# Patient Record
Sex: Female | Born: 1955 | Race: White | Hispanic: No | Smoking: Former smoker
Health system: Southern US, Community
[De-identification: ages and names within clinical notes are randomized; demographics above are authoritative.]

## PROBLEM LIST (undated history)

## (undated) DIAGNOSIS — F329 Major depressive disorder, single episode, unspecified: Secondary | ICD-10-CM

## (undated) DIAGNOSIS — F32A Depression, unspecified: Secondary | ICD-10-CM

## (undated) DIAGNOSIS — K219 Gastro-esophageal reflux disease without esophagitis: Secondary | ICD-10-CM

## (undated) DIAGNOSIS — G2581 Restless legs syndrome: Secondary | ICD-10-CM

## (undated) DIAGNOSIS — I10 Essential (primary) hypertension: Secondary | ICD-10-CM

## (undated) DIAGNOSIS — Z9889 Other specified postprocedural states: Secondary | ICD-10-CM

## (undated) DIAGNOSIS — Z972 Presence of dental prosthetic device (complete) (partial): Secondary | ICD-10-CM

## (undated) DIAGNOSIS — Z8489 Family history of other specified conditions: Secondary | ICD-10-CM

## (undated) DIAGNOSIS — K625 Hemorrhage of anus and rectum: Secondary | ICD-10-CM

## (undated) DIAGNOSIS — M1712 Unilateral primary osteoarthritis, left knee: Secondary | ICD-10-CM

## (undated) DIAGNOSIS — F419 Anxiety disorder, unspecified: Secondary | ICD-10-CM

## (undated) DIAGNOSIS — E119 Type 2 diabetes mellitus without complications: Secondary | ICD-10-CM

## (undated) DIAGNOSIS — T7840XA Allergy, unspecified, initial encounter: Secondary | ICD-10-CM

## (undated) DIAGNOSIS — M1711 Unilateral primary osteoarthritis, right knee: Secondary | ICD-10-CM

## (undated) DIAGNOSIS — Z8719 Personal history of other diseases of the digestive system: Secondary | ICD-10-CM

## (undated) DIAGNOSIS — R112 Nausea with vomiting, unspecified: Secondary | ICD-10-CM

## (undated) HISTORY — DX: Allergy, unspecified, initial encounter: T78.40XA

## (undated) HISTORY — DX: Hemorrhage of anus and rectum: K62.5

## (undated) HISTORY — DX: Restless legs syndrome: G25.81

## (undated) HISTORY — PX: PARTIAL KNEE ARTHROPLASTY: SHX2174

## (undated) HISTORY — DX: Depression, unspecified: F32.A

## (undated) HISTORY — PX: NO PAST SURGERIES: SHX2092

## (undated) HISTORY — DX: Presence of dental prosthetic device (complete) (partial): Z97.2

## (undated) HISTORY — DX: Major depressive disorder, single episode, unspecified: F32.9

---

## 1997-08-10 ENCOUNTER — Ambulatory Visit (HOSPITAL_COMMUNITY): Admission: RE | Admit: 1997-08-10 | Discharge: 1997-08-10 | Payer: Self-pay | Admitting: Obstetrics and Gynecology

## 1997-08-13 ENCOUNTER — Ambulatory Visit (HOSPITAL_COMMUNITY): Admission: RE | Admit: 1997-08-13 | Discharge: 1997-08-13 | Payer: Self-pay | Admitting: Obstetrics and Gynecology

## 2010-12-22 ENCOUNTER — Ambulatory Visit (INDEPENDENT_AMBULATORY_CARE_PROVIDER_SITE_OTHER): Payer: BC Managed Care – PPO | Admitting: General Surgery

## 2010-12-22 ENCOUNTER — Encounter (INDEPENDENT_AMBULATORY_CARE_PROVIDER_SITE_OTHER): Payer: Self-pay | Admitting: General Surgery

## 2010-12-22 VITALS — BP 156/88 | HR 64 | Temp 98.4°F | Ht 67.0 in | Wt 221.8 lb

## 2010-12-22 DIAGNOSIS — K644 Residual hemorrhoidal skin tags: Secondary | ICD-10-CM | POA: Insufficient documentation

## 2010-12-22 NOTE — Patient Instructions (Signed)
Calmoseptine to rectum 2-3 times a day and after showers or bowel movements

## 2010-12-27 NOTE — Progress Notes (Signed)
Subjective:     Patient ID: Jane Brown, female   DOB: 1955-11-03, 55 y.o.   MRN: [id number]  HPI We are asked to see the patient in consultation by Dr. Aram Beecham white to evaluate her for hemorrhoids. The patient is a 56 year old female who states she's had the pain for a long time at her rectum secondary to hemorrhoids. She will only on occasion notice any blood with her stool. She denies any constipation. She has had 2 colonoscopies in the past that were unremarkable.  Review of Systems  Constitutional: Negative.   HENT: Negative.   Eyes: Negative.   Respiratory: Negative.   Cardiovascular: Negative.   Gastrointestinal: Negative.   Genitourinary: Negative.   Musculoskeletal: Negative.   Skin: Negative.   Neurological: Negative.   Hematological: Negative.   Psychiatric/Behavioral: Negative.        Objective:   Physical Exam  Constitutional: She is oriented to person, place, and time. She appears well-developed and well-nourished.  HENT:  Head: Normocephalic and atraumatic.  Eyes: Conjunctivae and EOM are normal. Pupils are equal, round, and reactive to light.  Neck: Normal range of motion. Neck supple.  Cardiovascular: Normal rate, regular rhythm and normal heart sounds.   Pulmonary/Chest: Effort normal and breath sounds normal.  Abdominal: Soft. Bowel sounds are normal.  Genitourinary:       The patient has some minor soft external hemorrhoidal skin tags. She has some minor irritation to the perirectal area. On digital exam there is no palpable mass. On anoscopic exam she has minimal internal hemorrhoidal tissue.  Musculoskeletal: Normal range of motion.  Neurological: She is alert and oriented to person, place, and time.  Skin: Skin is warm and dry.  Psychiatric: She has a normal mood and affect. Her behavior is normal.       Assessment:     Given her exam I suspect that most of her discomfort is coming from pruritus ani.    Plan:     I will try her on Calmoseptine  ointment to her perirectal area several times a day for the next few weeks. She will do baby wipes after bowel movements. We will see her back in about 3 weeks.

## 2011-01-12 ENCOUNTER — Encounter (INDEPENDENT_AMBULATORY_CARE_PROVIDER_SITE_OTHER): Payer: BC Managed Care – PPO | Admitting: General Surgery

## 2011-02-20 ENCOUNTER — Encounter (INDEPENDENT_AMBULATORY_CARE_PROVIDER_SITE_OTHER): Payer: BC Managed Care – PPO | Admitting: General Surgery

## 2011-03-21 ENCOUNTER — Inpatient Hospital Stay (HOSPITAL_COMMUNITY): Admit: 2011-03-21 | Payer: Self-pay

## 2011-03-21 ENCOUNTER — Other Ambulatory Visit (HOSPITAL_COMMUNITY): Admission: RE | Admit: 2011-03-21 | Payer: BC Managed Care – PPO | Source: Ambulatory Visit | Admitting: Family Medicine

## 2011-03-21 DIAGNOSIS — Z0000 Encounter for general adult medical examination without abnormal findings: Secondary | ICD-10-CM | POA: Insufficient documentation

## 2012-09-10 ENCOUNTER — Ambulatory Visit: Payer: Self-pay

## 2013-12-26 ENCOUNTER — Other Ambulatory Visit: Payer: Self-pay | Admitting: Family Medicine

## 2013-12-26 ENCOUNTER — Other Ambulatory Visit (HOSPITAL_COMMUNITY)
Admission: RE | Admit: 2013-12-26 | Discharge: 2013-12-26 | Disposition: A | Discharge status: Home / Self Care | Payer: BC Managed Care – PPO | Source: Ambulatory Visit | Attending: Family Medicine | Admitting: Family Medicine

## 2013-12-26 DIAGNOSIS — Z0000 Encounter for general adult medical examination without abnormal findings: Secondary | ICD-10-CM | POA: Diagnosis present

## 2013-12-29 LAB — CYTOLOGY - PAP

## 2014-07-20 DIAGNOSIS — N7689 Other specified inflammation of vagina and vulva: Secondary | ICD-10-CM | POA: Diagnosis not present

## 2014-10-14 DIAGNOSIS — R922 Inconclusive mammogram: Secondary | ICD-10-CM | POA: Diagnosis not present

## 2014-10-14 DIAGNOSIS — R928 Other abnormal and inconclusive findings on diagnostic imaging of breast: Secondary | ICD-10-CM | POA: Diagnosis not present

## 2015-01-22 DIAGNOSIS — M25562 Pain in left knee: Secondary | ICD-10-CM | POA: Diagnosis not present

## 2015-01-22 DIAGNOSIS — M1712 Unilateral primary osteoarthritis, left knee: Secondary | ICD-10-CM | POA: Diagnosis not present

## 2015-01-29 DIAGNOSIS — M170 Bilateral primary osteoarthritis of knee: Secondary | ICD-10-CM | POA: Diagnosis not present

## 2015-01-29 DIAGNOSIS — R262 Difficulty in walking, not elsewhere classified: Secondary | ICD-10-CM | POA: Diagnosis not present

## 2015-01-29 DIAGNOSIS — M25562 Pain in left knee: Secondary | ICD-10-CM | POA: Diagnosis not present

## 2015-01-29 DIAGNOSIS — M25561 Pain in right knee: Secondary | ICD-10-CM | POA: Diagnosis not present

## 2015-02-03 DIAGNOSIS — M170 Bilateral primary osteoarthritis of knee: Secondary | ICD-10-CM | POA: Diagnosis not present

## 2015-02-03 DIAGNOSIS — M25561 Pain in right knee: Secondary | ICD-10-CM | POA: Diagnosis not present

## 2015-02-03 DIAGNOSIS — M1712 Unilateral primary osteoarthritis, left knee: Secondary | ICD-10-CM | POA: Diagnosis not present

## 2015-02-03 DIAGNOSIS — M25562 Pain in left knee: Secondary | ICD-10-CM | POA: Diagnosis not present

## 2015-02-03 DIAGNOSIS — R2689 Other abnormalities of gait and mobility: Secondary | ICD-10-CM | POA: Diagnosis not present

## 2015-02-05 DIAGNOSIS — M25561 Pain in right knee: Secondary | ICD-10-CM | POA: Diagnosis not present

## 2015-02-05 DIAGNOSIS — M25562 Pain in left knee: Secondary | ICD-10-CM | POA: Diagnosis not present

## 2015-02-05 DIAGNOSIS — M1711 Unilateral primary osteoarthritis, right knee: Secondary | ICD-10-CM | POA: Diagnosis not present

## 2015-02-05 DIAGNOSIS — M170 Bilateral primary osteoarthritis of knee: Secondary | ICD-10-CM | POA: Diagnosis not present

## 2015-02-05 DIAGNOSIS — R2689 Other abnormalities of gait and mobility: Secondary | ICD-10-CM | POA: Diagnosis not present

## 2015-02-08 DIAGNOSIS — M25562 Pain in left knee: Secondary | ICD-10-CM | POA: Diagnosis not present

## 2015-02-08 DIAGNOSIS — R269 Unspecified abnormalities of gait and mobility: Secondary | ICD-10-CM | POA: Diagnosis not present

## 2015-02-08 DIAGNOSIS — M25561 Pain in right knee: Secondary | ICD-10-CM | POA: Diagnosis not present

## 2015-02-08 DIAGNOSIS — M1712 Unilateral primary osteoarthritis, left knee: Secondary | ICD-10-CM | POA: Diagnosis not present

## 2015-02-08 DIAGNOSIS — M170 Bilateral primary osteoarthritis of knee: Secondary | ICD-10-CM | POA: Diagnosis not present

## 2015-02-12 DIAGNOSIS — M25562 Pain in left knee: Secondary | ICD-10-CM | POA: Diagnosis not present

## 2015-02-12 DIAGNOSIS — R269 Unspecified abnormalities of gait and mobility: Secondary | ICD-10-CM | POA: Diagnosis not present

## 2015-02-12 DIAGNOSIS — M1711 Unilateral primary osteoarthritis, right knee: Secondary | ICD-10-CM | POA: Diagnosis not present

## 2015-02-12 DIAGNOSIS — M170 Bilateral primary osteoarthritis of knee: Secondary | ICD-10-CM | POA: Diagnosis not present

## 2015-02-12 DIAGNOSIS — M25561 Pain in right knee: Secondary | ICD-10-CM | POA: Diagnosis not present

## 2015-02-15 DIAGNOSIS — N952 Postmenopausal atrophic vaginitis: Secondary | ICD-10-CM | POA: Diagnosis not present

## 2015-02-15 DIAGNOSIS — K648 Other hemorrhoids: Secondary | ICD-10-CM | POA: Diagnosis not present

## 2015-02-15 DIAGNOSIS — G2581 Restless legs syndrome: Secondary | ICD-10-CM | POA: Diagnosis not present

## 2015-02-15 DIAGNOSIS — Z86010 Personal history of colonic polyps: Secondary | ICD-10-CM | POA: Diagnosis not present

## 2015-02-17 DIAGNOSIS — M25562 Pain in left knee: Secondary | ICD-10-CM | POA: Diagnosis not present

## 2015-02-17 DIAGNOSIS — M1712 Unilateral primary osteoarthritis, left knee: Secondary | ICD-10-CM | POA: Diagnosis not present

## 2015-02-18 DIAGNOSIS — R269 Unspecified abnormalities of gait and mobility: Secondary | ICD-10-CM | POA: Diagnosis not present

## 2015-02-18 DIAGNOSIS — M25562 Pain in left knee: Secondary | ICD-10-CM | POA: Diagnosis not present

## 2015-02-18 DIAGNOSIS — M1711 Unilateral primary osteoarthritis, right knee: Secondary | ICD-10-CM | POA: Diagnosis not present

## 2015-02-18 DIAGNOSIS — M25561 Pain in right knee: Secondary | ICD-10-CM | POA: Diagnosis not present

## 2015-02-18 DIAGNOSIS — M170 Bilateral primary osteoarthritis of knee: Secondary | ICD-10-CM | POA: Diagnosis not present

## 2015-02-22 DIAGNOSIS — M170 Bilateral primary osteoarthritis of knee: Secondary | ICD-10-CM | POA: Diagnosis not present

## 2015-02-22 DIAGNOSIS — M1712 Unilateral primary osteoarthritis, left knee: Secondary | ICD-10-CM | POA: Diagnosis not present

## 2015-02-22 DIAGNOSIS — R269 Unspecified abnormalities of gait and mobility: Secondary | ICD-10-CM | POA: Diagnosis not present

## 2015-02-22 DIAGNOSIS — M25562 Pain in left knee: Secondary | ICD-10-CM | POA: Diagnosis not present

## 2015-02-22 DIAGNOSIS — M25561 Pain in right knee: Secondary | ICD-10-CM | POA: Diagnosis not present

## 2015-02-25 DIAGNOSIS — M1711 Unilateral primary osteoarthritis, right knee: Secondary | ICD-10-CM | POA: Diagnosis not present

## 2015-02-25 DIAGNOSIS — M25561 Pain in right knee: Secondary | ICD-10-CM | POA: Diagnosis not present

## 2015-02-25 DIAGNOSIS — M25562 Pain in left knee: Secondary | ICD-10-CM | POA: Diagnosis not present

## 2015-02-25 DIAGNOSIS — M170 Bilateral primary osteoarthritis of knee: Secondary | ICD-10-CM | POA: Diagnosis not present

## 2015-02-25 DIAGNOSIS — R262 Difficulty in walking, not elsewhere classified: Secondary | ICD-10-CM | POA: Diagnosis not present

## 2015-03-08 DIAGNOSIS — Z86010 Personal history of colonic polyps: Secondary | ICD-10-CM | POA: Diagnosis not present

## 2015-03-08 DIAGNOSIS — D125 Benign neoplasm of sigmoid colon: Secondary | ICD-10-CM | POA: Diagnosis not present

## 2015-03-08 DIAGNOSIS — K635 Polyp of colon: Secondary | ICD-10-CM | POA: Diagnosis not present

## 2015-06-08 DIAGNOSIS — M170 Bilateral primary osteoarthritis of knee: Secondary | ICD-10-CM | POA: Diagnosis not present

## 2015-06-08 DIAGNOSIS — M25562 Pain in left knee: Secondary | ICD-10-CM | POA: Diagnosis not present

## 2015-06-08 DIAGNOSIS — M7052 Other bursitis of knee, left knee: Secondary | ICD-10-CM | POA: Diagnosis not present

## 2015-06-14 DIAGNOSIS — M25562 Pain in left knee: Secondary | ICD-10-CM | POA: Diagnosis not present

## 2015-06-14 DIAGNOSIS — M1712 Unilateral primary osteoarthritis, left knee: Secondary | ICD-10-CM | POA: Diagnosis not present

## 2015-07-08 DIAGNOSIS — Z1231 Encounter for screening mammogram for malignant neoplasm of breast: Secondary | ICD-10-CM | POA: Diagnosis not present

## 2015-08-17 DIAGNOSIS — E559 Vitamin D deficiency, unspecified: Secondary | ICD-10-CM | POA: Diagnosis not present

## 2015-08-17 DIAGNOSIS — K219 Gastro-esophageal reflux disease without esophagitis: Secondary | ICD-10-CM | POA: Diagnosis not present

## 2015-08-17 DIAGNOSIS — G2581 Restless legs syndrome: Secondary | ICD-10-CM | POA: Diagnosis not present

## 2015-08-17 DIAGNOSIS — E785 Hyperlipidemia, unspecified: Secondary | ICD-10-CM | POA: Diagnosis not present

## 2015-08-17 DIAGNOSIS — N952 Postmenopausal atrophic vaginitis: Secondary | ICD-10-CM | POA: Diagnosis not present

## 2015-08-17 DIAGNOSIS — Z23 Encounter for immunization: Secondary | ICD-10-CM | POA: Diagnosis not present

## 2015-08-17 DIAGNOSIS — K641 Second degree hemorrhoids: Secondary | ICD-10-CM | POA: Diagnosis not present

## 2015-08-17 DIAGNOSIS — Z0000 Encounter for general adult medical examination without abnormal findings: Secondary | ICD-10-CM | POA: Diagnosis not present

## 2015-08-18 DIAGNOSIS — K648 Other hemorrhoids: Secondary | ICD-10-CM | POA: Diagnosis not present

## 2015-08-18 DIAGNOSIS — Z86010 Personal history of colonic polyps: Secondary | ICD-10-CM | POA: Diagnosis not present

## 2015-08-23 DIAGNOSIS — K641 Second degree hemorrhoids: Secondary | ICD-10-CM | POA: Diagnosis not present

## 2015-09-14 DIAGNOSIS — K641 Second degree hemorrhoids: Secondary | ICD-10-CM | POA: Diagnosis not present

## 2015-09-28 DIAGNOSIS — K641 Second degree hemorrhoids: Secondary | ICD-10-CM | POA: Diagnosis not present

## 2016-01-19 DIAGNOSIS — M25561 Pain in right knee: Secondary | ICD-10-CM | POA: Diagnosis not present

## 2016-01-19 DIAGNOSIS — M1712 Unilateral primary osteoarthritis, left knee: Secondary | ICD-10-CM | POA: Diagnosis not present

## 2016-01-19 DIAGNOSIS — M170 Bilateral primary osteoarthritis of knee: Secondary | ICD-10-CM | POA: Diagnosis not present

## 2016-01-19 DIAGNOSIS — R262 Difficulty in walking, not elsewhere classified: Secondary | ICD-10-CM | POA: Diagnosis not present

## 2016-01-19 DIAGNOSIS — M25562 Pain in left knee: Secondary | ICD-10-CM | POA: Diagnosis not present

## 2016-01-31 DIAGNOSIS — M25562 Pain in left knee: Secondary | ICD-10-CM | POA: Diagnosis not present

## 2016-01-31 DIAGNOSIS — M25561 Pain in right knee: Secondary | ICD-10-CM | POA: Diagnosis not present

## 2016-01-31 DIAGNOSIS — M170 Bilateral primary osteoarthritis of knee: Secondary | ICD-10-CM | POA: Diagnosis not present

## 2016-02-07 DIAGNOSIS — M1712 Unilateral primary osteoarthritis, left knee: Secondary | ICD-10-CM | POA: Diagnosis not present

## 2016-02-07 DIAGNOSIS — M25562 Pain in left knee: Secondary | ICD-10-CM | POA: Diagnosis not present

## 2016-02-10 DIAGNOSIS — M25561 Pain in right knee: Secondary | ICD-10-CM | POA: Diagnosis not present

## 2016-02-10 DIAGNOSIS — M1711 Unilateral primary osteoarthritis, right knee: Secondary | ICD-10-CM | POA: Diagnosis not present

## 2016-02-14 DIAGNOSIS — M170 Bilateral primary osteoarthritis of knee: Secondary | ICD-10-CM | POA: Diagnosis not present

## 2016-02-14 DIAGNOSIS — M25562 Pain in left knee: Secondary | ICD-10-CM | POA: Diagnosis not present

## 2016-02-14 DIAGNOSIS — M25561 Pain in right knee: Secondary | ICD-10-CM | POA: Diagnosis not present

## 2016-05-16 DIAGNOSIS — K219 Gastro-esophageal reflux disease without esophagitis: Secondary | ICD-10-CM | POA: Diagnosis not present

## 2016-05-16 DIAGNOSIS — F330 Major depressive disorder, recurrent, mild: Secondary | ICD-10-CM | POA: Diagnosis not present

## 2016-05-16 DIAGNOSIS — G2581 Restless legs syndrome: Secondary | ICD-10-CM | POA: Diagnosis not present

## 2016-05-23 DIAGNOSIS — M170 Bilateral primary osteoarthritis of knee: Secondary | ICD-10-CM | POA: Diagnosis not present

## 2016-05-23 DIAGNOSIS — R262 Difficulty in walking, not elsewhere classified: Secondary | ICD-10-CM | POA: Diagnosis not present

## 2016-05-23 DIAGNOSIS — M25561 Pain in right knee: Secondary | ICD-10-CM | POA: Diagnosis not present

## 2016-05-23 DIAGNOSIS — M25562 Pain in left knee: Secondary | ICD-10-CM | POA: Diagnosis not present

## 2016-06-16 DIAGNOSIS — F419 Anxiety disorder, unspecified: Secondary | ICD-10-CM | POA: Diagnosis not present

## 2016-06-16 DIAGNOSIS — F330 Major depressive disorder, recurrent, mild: Secondary | ICD-10-CM | POA: Diagnosis not present

## 2016-08-14 DIAGNOSIS — Z1231 Encounter for screening mammogram for malignant neoplasm of breast: Secondary | ICD-10-CM | POA: Diagnosis not present

## 2016-08-22 DIAGNOSIS — M25562 Pain in left knee: Secondary | ICD-10-CM | POA: Diagnosis not present

## 2016-08-22 DIAGNOSIS — M1712 Unilateral primary osteoarthritis, left knee: Secondary | ICD-10-CM | POA: Diagnosis not present

## 2016-08-22 DIAGNOSIS — M170 Bilateral primary osteoarthritis of knee: Secondary | ICD-10-CM | POA: Diagnosis not present

## 2016-08-22 DIAGNOSIS — M25561 Pain in right knee: Secondary | ICD-10-CM | POA: Diagnosis not present

## 2016-09-04 DIAGNOSIS — N3001 Acute cystitis with hematuria: Secondary | ICD-10-CM | POA: Diagnosis not present

## 2016-09-04 DIAGNOSIS — R3915 Urgency of urination: Secondary | ICD-10-CM | POA: Diagnosis not present

## 2016-09-28 DIAGNOSIS — R300 Dysuria: Secondary | ICD-10-CM | POA: Diagnosis not present

## 2016-09-28 DIAGNOSIS — N3001 Acute cystitis with hematuria: Secondary | ICD-10-CM | POA: Diagnosis not present

## 2016-11-29 DIAGNOSIS — M170 Bilateral primary osteoarthritis of knee: Secondary | ICD-10-CM | POA: Diagnosis not present

## 2016-11-29 DIAGNOSIS — M25562 Pain in left knee: Secondary | ICD-10-CM | POA: Diagnosis not present

## 2016-11-29 DIAGNOSIS — M25561 Pain in right knee: Secondary | ICD-10-CM | POA: Diagnosis not present

## 2016-12-07 DIAGNOSIS — M1712 Unilateral primary osteoarthritis, left knee: Secondary | ICD-10-CM | POA: Diagnosis not present

## 2016-12-07 DIAGNOSIS — M25562 Pain in left knee: Secondary | ICD-10-CM | POA: Diagnosis not present

## 2016-12-14 DIAGNOSIS — F330 Major depressive disorder, recurrent, mild: Secondary | ICD-10-CM | POA: Diagnosis not present

## 2016-12-14 DIAGNOSIS — M1712 Unilateral primary osteoarthritis, left knee: Secondary | ICD-10-CM | POA: Diagnosis not present

## 2016-12-14 DIAGNOSIS — G2581 Restless legs syndrome: Secondary | ICD-10-CM | POA: Diagnosis not present

## 2016-12-14 DIAGNOSIS — M25562 Pain in left knee: Secondary | ICD-10-CM | POA: Diagnosis not present

## 2016-12-14 DIAGNOSIS — F419 Anxiety disorder, unspecified: Secondary | ICD-10-CM | POA: Diagnosis not present

## 2016-12-21 DIAGNOSIS — M1712 Unilateral primary osteoarthritis, left knee: Secondary | ICD-10-CM | POA: Diagnosis not present

## 2016-12-21 DIAGNOSIS — M25562 Pain in left knee: Secondary | ICD-10-CM | POA: Diagnosis not present

## 2017-02-14 DIAGNOSIS — R300 Dysuria: Secondary | ICD-10-CM | POA: Diagnosis not present

## 2017-02-14 DIAGNOSIS — N3000 Acute cystitis without hematuria: Secondary | ICD-10-CM | POA: Diagnosis not present

## 2017-07-05 DIAGNOSIS — Z789 Other specified health status: Secondary | ICD-10-CM | POA: Diagnosis not present

## 2017-07-05 DIAGNOSIS — M1712 Unilateral primary osteoarthritis, left knee: Secondary | ICD-10-CM | POA: Diagnosis not present

## 2017-07-05 DIAGNOSIS — M25562 Pain in left knee: Secondary | ICD-10-CM | POA: Diagnosis not present

## 2017-07-12 ENCOUNTER — Other Ambulatory Visit (HOSPITAL_COMMUNITY)
Admission: RE | Admit: 2017-07-12 | Discharge: 2017-07-12 | Disposition: A | Discharge status: Home / Self Care | Payer: Medicare HMO | Source: Ambulatory Visit | Attending: Family Medicine | Admitting: Family Medicine

## 2017-07-12 ENCOUNTER — Other Ambulatory Visit: Payer: Self-pay | Admitting: Family Medicine

## 2017-07-12 DIAGNOSIS — K219 Gastro-esophageal reflux disease without esophagitis: Secondary | ICD-10-CM | POA: Diagnosis not present

## 2017-07-12 DIAGNOSIS — Z79899 Other long term (current) drug therapy: Secondary | ICD-10-CM | POA: Diagnosis not present

## 2017-07-12 DIAGNOSIS — E785 Hyperlipidemia, unspecified: Secondary | ICD-10-CM | POA: Diagnosis not present

## 2017-07-12 DIAGNOSIS — Z124 Encounter for screening for malignant neoplasm of cervix: Secondary | ICD-10-CM | POA: Diagnosis not present

## 2017-07-12 DIAGNOSIS — Z0000 Encounter for general adult medical examination without abnormal findings: Secondary | ICD-10-CM | POA: Diagnosis not present

## 2017-07-12 DIAGNOSIS — Z01419 Encounter for gynecological examination (general) (routine) without abnormal findings: Secondary | ICD-10-CM | POA: Diagnosis not present

## 2017-07-12 DIAGNOSIS — Z136 Encounter for screening for cardiovascular disorders: Secondary | ICD-10-CM | POA: Diagnosis not present

## 2017-07-12 DIAGNOSIS — E2839 Other primary ovarian failure: Secondary | ICD-10-CM | POA: Diagnosis not present

## 2017-07-12 DIAGNOSIS — E559 Vitamin D deficiency, unspecified: Secondary | ICD-10-CM | POA: Diagnosis not present

## 2017-07-12 DIAGNOSIS — F330 Major depressive disorder, recurrent, mild: Secondary | ICD-10-CM | POA: Diagnosis not present

## 2017-07-17 LAB — CYTOLOGY - PAP
Diagnosis: UNDETERMINED — AB
HPV (WINDOPATH): DETECTED — AB

## 2017-08-07 ENCOUNTER — Other Ambulatory Visit: Payer: Self-pay | Admitting: Obstetrics and Gynecology

## 2017-08-07 DIAGNOSIS — N871 Moderate cervical dysplasia: Secondary | ICD-10-CM | POA: Diagnosis not present

## 2017-08-07 DIAGNOSIS — N870 Mild cervical dysplasia: Secondary | ICD-10-CM | POA: Diagnosis not present

## 2017-09-11 ENCOUNTER — Other Ambulatory Visit: Payer: Self-pay | Admitting: Obstetrics and Gynecology

## 2017-09-11 DIAGNOSIS — N870 Mild cervical dysplasia: Secondary | ICD-10-CM | POA: Diagnosis not present

## 2017-09-12 DIAGNOSIS — E2839 Other primary ovarian failure: Secondary | ICD-10-CM | POA: Diagnosis not present

## 2017-09-17 DIAGNOSIS — L570 Actinic keratosis: Secondary | ICD-10-CM | POA: Diagnosis not present

## 2017-09-17 DIAGNOSIS — L819 Disorder of pigmentation, unspecified: Secondary | ICD-10-CM | POA: Diagnosis not present

## 2017-09-26 DIAGNOSIS — Z9889 Other specified postprocedural states: Secondary | ICD-10-CM | POA: Diagnosis not present

## 2017-09-27 DIAGNOSIS — Z1231 Encounter for screening mammogram for malignant neoplasm of breast: Secondary | ICD-10-CM | POA: Diagnosis not present

## 2017-10-03 DIAGNOSIS — M25562 Pain in left knee: Secondary | ICD-10-CM | POA: Diagnosis not present

## 2017-10-03 DIAGNOSIS — M1711 Unilateral primary osteoarthritis, right knee: Secondary | ICD-10-CM | POA: Diagnosis not present

## 2017-10-09 DIAGNOSIS — Z9889 Other specified postprocedural states: Secondary | ICD-10-CM | POA: Diagnosis not present

## 2017-10-10 DIAGNOSIS — Z01818 Encounter for other preprocedural examination: Secondary | ICD-10-CM | POA: Diagnosis not present

## 2017-11-08 NOTE — Pre-Procedure Instructions (Signed)
MAEVA DANT  11/08/2017      [id number] [address], [address] AT [address] [place] [address] Phone: [phone] Fax: [phone]    Your procedure is scheduled on Tues., November 20, 2017 from 7:30AM-9:40AM  Report to [facility] Admitting Entrance "A" at 5:30AM  Call this number if you have problems the morning of surgery:  [phone]   Remember:  Do not eat or drink after midnight on July 22nd    Take these medicines the morning of surgery with A SIP OF WATER: FLUoxetine (PROZAC) Omeprazole (PRILOSEC)   7 days before surgery (7/16), stop taking all Other Aspirin Products, Vitamins, Fish oils, and Herbal medications. Also stop all NSAIDS i.e. Advil, Ibuprofen, Motrin, Aleve, Anaprox, Naproxen, BC, Goody Powders, and all Supplements.    Do not wear jewelry, make-up or nail polish.  Do not wear lotions, powders, or perfumes, or deodorant.  Do not shave 48 hours prior to surgery.    Do not bring valuables to the hospital.  [facility] is not responsible for any belongings or valuables.  Contacts, dentures or bridgework may not be worn into surgery.  Leave your suitcase in the car.  After surgery it may be brought to your room.  For patients admitted to the hospital, discharge time will be determined by your treatment team.  Patients discharged the day of surgery will not be allowed to drive home.   Special instructions:   [facility]- Preparing For Surgery  Before surgery, you can play an important role. Because skin is not sterile, your skin needs to be as free of germs as possible. You can reduce the number of germs on your skin by washing with CHG (chlorahexidine gluconate) Soap before surgery.  CHG is an antiseptic cleaner which kills germs and bonds with the skin to continue killing germs even after washing.    Oral Hygiene is also important to reduce your risk of infection.   Remember - BRUSH YOUR TEETH THE MORNING OF SURGERY WITH YOUR REGULAR TOOTHPASTE  Please do not use if you have an allergy to CHG or antibacterial soaps. If your skin becomes reddened/irritated stop using the CHG.  Do not shave (including legs and underarms) for at least 48 hours prior to first CHG shower. It is OK to shave your face.  Please follow these instructions carefully.   1. Shower the NIGHT BEFORE SURGERY and the MORNING OF SURGERY with CHG.   2. If you chose to wash your hair, wash your hair first as usual with your normal shampoo.  3. After you shampoo, rinse your hair and body thoroughly to remove the shampoo.  4. Use CHG as you would any other liquid soap. You can apply CHG directly to the skin and wash gently with a scrungie or a clean washcloth.   5. Apply the CHG Soap to your body ONLY FROM THE NECK DOWN.  Do not use on open wounds or open sores. Avoid contact with your eyes, ears, mouth and genitals (private parts). Wash Face and genitals (private parts)  with your normal soap.  6. Wash thoroughly, paying special attention to the area where your surgery will be performed.  7. Thoroughly rinse your body with warm water from the neck down.  8. DO NOT shower/wash with your normal soap after using and rinsing off the CHG Soap.  9. Pat yourself dry with a CLEAN TOWEL.  10.  Wear CLEAN PAJAMAS to bed the night before surgery, wear comfortable clothes the morning of surgery  11. Place CLEAN SHEETS on your bed the night of your first shower and DO NOT SLEEP WITH PETS.  Day of Surgery:  Do not apply any deodorants/lotions.  Please wear clean clothes to the hospital/surgery center.   Remember to brush your teeth WITH YOUR REGULAR TOOTHPASTE.  Please read over the following fact sheets that you were given. Pain Booklet, Coughing and Deep Breathing, MRSA Information and Surgical Site Infection Prevention

## 2017-11-09 ENCOUNTER — Other Ambulatory Visit: Payer: Self-pay

## 2017-11-09 ENCOUNTER — Encounter (HOSPITAL_COMMUNITY)
Admission: RE | Admit: 2017-11-09 | Discharge: 2017-11-09 | Disposition: A | Discharge status: Home / Self Care | Payer: Medicare HMO | Source: Ambulatory Visit | Attending: Orthopedic Surgery | Admitting: Orthopedic Surgery

## 2017-11-09 ENCOUNTER — Encounter (HOSPITAL_COMMUNITY): Payer: Self-pay

## 2017-11-09 DIAGNOSIS — Z01812 Encounter for preprocedural laboratory examination: Secondary | ICD-10-CM | POA: Diagnosis not present

## 2017-11-09 HISTORY — DX: Family history of other specified conditions: Z84.89

## 2017-11-09 LAB — BASIC METABOLIC PANEL
Anion gap: 11 (ref 5–15)
BUN: 9 mg/dL (ref 6–20)
CALCIUM: 9.3 mg/dL (ref 8.9–10.3)
CO2: 22 mmol/L (ref 22–32)
Chloride: 107 mmol/L (ref 98–111)
Creatinine, Ser: 0.87 mg/dL (ref 0.44–1.00)
GFR calc Af Amer: >60 mL/min (ref >60)
GFR calc non Af Amer: >60 mL/min (ref >60)
GLUCOSE: 119 mg/dL — AB (ref 70–99)
Potassium: 3.7 mmol/L (ref 3.5–5.1)
SODIUM: 140 mmol/L (ref 135–145)

## 2017-11-09 LAB — CBC
HCT: 43.6 % (ref 36.0–46.0)
Hemoglobin: 14 g/dL (ref 12.0–15.0)
MCH: 28.3 pg (ref 26.0–34.0)
MCHC: 32.1 g/dL (ref 30.0–36.0)
MCV: 88.1 fL (ref 78.0–100.0)
PLATELETS: 228 10*3/uL (ref 150–400)
RBC: 4.95 MIL/uL (ref 3.87–5.11)
RDW: 14 % (ref 11.5–15.5)
WBC: 8.1 10*3/uL (ref 4.0–10.5)

## 2017-11-09 LAB — SURGICAL PCR SCREEN
MRSA, PCR: NEGATIVE
STAPHYLOCOCCUS AUREUS: POSITIVE — AB

## 2017-11-09 NOTE — Progress Notes (Addendum)
PCP: Harlan Stains, MD  Cardiologist: pt denies  EKG:  10/2017-requested from Harlan Stains, MD  Stress test: pt denies  ECHO: pt denies  Cardiac Cath: pt denies  Chest x-ray: pt denies past year, no recent respiratory complications/infections

## 2017-11-15 DIAGNOSIS — H524 Presbyopia: Secondary | ICD-10-CM | POA: Diagnosis not present

## 2017-11-19 MED ORDER — TRANEXAMIC ACID 1000 MG/10ML IV SOLN
1000.0000 mg | INTRAVENOUS | Status: AC
  Administered 2017-11-20: 1000 mg via INTRAVENOUS
  Filled 2017-11-19: qty 1100

## 2017-11-19 NOTE — Anesthesia Preprocedure Evaluation (Addendum)
Anesthesia Evaluation  Patient identified by MRN, date of birth, ID band Patient awake    Reviewed: Allergy & Precautions, H&P , NPO status , Patient's Chart, lab work & pertinent test results  History of Anesthesia Complications Negative for: history of anesthetic complications  Airway Mallampati: II  TM Distance: >3 FB Neck ROM: full    Dental  (+) Edentulous Upper, Missing, Dental Advidsory Given   Pulmonary former smoker,    breath sounds clear to auscultation       Cardiovascular (-) anginanegative cardio ROS   Rhythm:regular Rate:Normal     Neuro/Psych PSYCHIATRIC DISORDERS Depression negative neurological ROS     GI/Hepatic Neg liver ROS, GERD  Medicated and Controlled,  Endo/Other  Morbid obesity  Renal/GU negative Renal ROS     Musculoskeletal  (+) Arthritis , Osteoarthritis,    Abdominal (+) + obese,   Peds  Hematology plt 228k   Anesthesia Other Findings   Reproductive/Obstetrics                           Anesthesia Physical Anesthesia Plan  ASA: II  Anesthesia Plan: Spinal   Post-op Pain Management:  Regional for Post-op pain   Induction:   PONV Risk Score and Plan: 2 and Ondansetron and Dexamethasone  Airway Management Planned: Natural Airway and Simple Face Mask  Additional Equipment:   Intra-op Plan:   Post-operative Plan:   Informed Consent: I have reviewed the patients History and Physical, chart, labs and discussed the procedure including the risks, benefits and alternatives for the proposed anesthesia with the patient or authorized representative who has indicated his/her understanding and acceptance.   Dental Advisory Given and Dental advisory given  Plan Discussed with: CRNA and Surgeon  Anesthesia Plan Comments: (Plan routine monitors, SAB with adductor canal block for post op analgesia)       Anesthesia Quick Evaluation

## 2017-11-20 ENCOUNTER — Encounter (HOSPITAL_COMMUNITY)
Admission: AD | Disposition: A | Discharge status: Home / Self Care | Payer: Self-pay | Source: Home / Self Care | Attending: Orthopedic Surgery

## 2017-11-20 ENCOUNTER — Encounter (HOSPITAL_COMMUNITY): Payer: Self-pay | Admitting: Orthopedic Surgery

## 2017-11-20 ENCOUNTER — Observation Stay (HOSPITAL_COMMUNITY): Payer: Medicare HMO

## 2017-11-20 ENCOUNTER — Ambulatory Visit (HOSPITAL_COMMUNITY): Payer: Medicare HMO | Admitting: Anesthesiology

## 2017-11-20 ENCOUNTER — Ambulatory Visit (HOSPITAL_COMMUNITY): Payer: Medicare HMO | Admitting: Emergency Medicine

## 2017-11-20 ENCOUNTER — Inpatient Hospital Stay (HOSPITAL_COMMUNITY)
Admission: AD | Admit: 2017-11-20 | Discharge: 2017-11-21 | DRG: 470 | Disposition: A | Discharge status: Home / Self Care | Payer: Medicare HMO | Attending: Orthopedic Surgery | Admitting: Orthopedic Surgery

## 2017-11-20 DIAGNOSIS — Z880 Allergy status to penicillin: Secondary | ICD-10-CM | POA: Diagnosis not present

## 2017-11-20 DIAGNOSIS — I509 Heart failure, unspecified: Secondary | ICD-10-CM | POA: Diagnosis present

## 2017-11-20 DIAGNOSIS — E119 Type 2 diabetes mellitus without complications: Secondary | ICD-10-CM | POA: Diagnosis not present

## 2017-11-20 DIAGNOSIS — I2510 Atherosclerotic heart disease of native coronary artery without angina pectoris: Secondary | ICD-10-CM | POA: Diagnosis not present

## 2017-11-20 DIAGNOSIS — Z471 Aftercare following joint replacement surgery: Secondary | ICD-10-CM | POA: Diagnosis not present

## 2017-11-20 DIAGNOSIS — K219 Gastro-esophageal reflux disease without esophagitis: Secondary | ICD-10-CM | POA: Diagnosis present

## 2017-11-20 DIAGNOSIS — J449 Chronic obstructive pulmonary disease, unspecified: Secondary | ICD-10-CM | POA: Diagnosis present

## 2017-11-20 DIAGNOSIS — Z96659 Presence of unspecified artificial knee joint: Secondary | ICD-10-CM

## 2017-11-20 DIAGNOSIS — Z6836 Body mass index (BMI) 36.0-36.9, adult: Secondary | ICD-10-CM | POA: Diagnosis not present

## 2017-11-20 DIAGNOSIS — I252 Old myocardial infarction: Secondary | ICD-10-CM

## 2017-11-20 DIAGNOSIS — Z79899 Other long term (current) drug therapy: Secondary | ICD-10-CM

## 2017-11-20 DIAGNOSIS — M1712 Unilateral primary osteoarthritis, left knee: Secondary | ICD-10-CM | POA: Diagnosis not present

## 2017-11-20 DIAGNOSIS — G8929 Other chronic pain: Secondary | ICD-10-CM | POA: Diagnosis present

## 2017-11-20 DIAGNOSIS — Z96652 Presence of left artificial knee joint: Secondary | ICD-10-CM

## 2017-11-20 DIAGNOSIS — K769 Liver disease, unspecified: Secondary | ICD-10-CM | POA: Diagnosis present

## 2017-11-20 DIAGNOSIS — Z87891 Personal history of nicotine dependence: Secondary | ICD-10-CM | POA: Diagnosis not present

## 2017-11-20 DIAGNOSIS — G8918 Other acute postprocedural pain: Secondary | ICD-10-CM | POA: Diagnosis not present

## 2017-11-20 HISTORY — DX: Unilateral primary osteoarthritis, left knee: M17.12

## 2017-11-20 HISTORY — PX: PARTIAL KNEE ARTHROPLASTY: SHX2174

## 2017-11-20 LAB — GLUCOSE, CAPILLARY: GLUCOSE-CAPILLARY: 113 mg/dL — AB (ref 70–99)

## 2017-11-20 SURGERY — ARTHROPLASTY, KNEE, UNICOMPARTMENTAL
Anesthesia: Spinal | Site: Knee | Laterality: Left

## 2017-11-20 MED ORDER — KETOROLAC TROMETHAMINE 15 MG/ML IJ SOLN
7.5000 mg | Freq: Four times a day (QID) | INTRAMUSCULAR | Status: DC
  Administered 2017-11-20 – 2017-11-21 (×3): 7.5 mg via INTRAVENOUS
  Filled 2017-11-20 (×3): qty 1

## 2017-11-20 MED ORDER — MAGNESIUM CITRATE PO SOLN: 1.0000 | Freq: Once | ORAL | Status: DC | PRN

## 2017-11-20 MED ORDER — ASPIRIN EC 325 MG PO TBEC
325.0000 mg | DELAYED_RELEASE_TABLET | Freq: Two times a day (BID) | ORAL | Status: DC
  Administered 2017-11-20 – 2017-11-21 (×2): 325 mg via ORAL
  Filled 2017-11-20 (×2): qty 1

## 2017-11-20 MED ORDER — HYDROCODONE-ACETAMINOPHEN 5-325 MG PO TABS
1.0000 | ORAL_TABLET | ORAL | Status: DC | PRN
  Administered 2017-11-20: 1 via ORAL

## 2017-11-20 MED ORDER — 0.9 % SODIUM CHLORIDE (POUR BTL) OPTIME
TOPICAL | Status: DC | PRN
  Administered 2017-11-20: 1000 mL

## 2017-11-20 MED ORDER — METOCLOPRAMIDE HCL 5 MG/ML IJ SOLN: 5.0000 mg | Freq: Three times a day (TID) | INTRAMUSCULAR | Status: DC | PRN

## 2017-11-20 MED ORDER — KETOROLAC TROMETHAMINE 30 MG/ML IJ SOLN
INTRAMUSCULAR | Status: AC
  Filled 2017-11-20: qty 1

## 2017-11-20 MED ORDER — MENTHOL 3 MG MT LOZG: 1.0000 | LOZENGE | OROMUCOSAL | Status: DC | PRN

## 2017-11-20 MED ORDER — METHOCARBAMOL 500 MG PO TABS
500.0000 mg | ORAL_TABLET | Freq: Four times a day (QID) | ORAL | Status: DC | PRN
  Administered 2017-11-20 (×2): 500 mg via ORAL
  Filled 2017-11-20: qty 1

## 2017-11-20 MED ORDER — ACETAMINOPHEN 325 MG PO TABS: 325.0000 mg | ORAL_TABLET | Freq: Four times a day (QID) | ORAL | Status: DC | PRN

## 2017-11-20 MED ORDER — DEXAMETHASONE SODIUM PHOSPHATE 10 MG/ML IJ SOLN
8.0000 mg | Freq: Once | INTRAMUSCULAR | Status: AC
  Administered 2017-11-20: 8 mg via INTRAVENOUS
  Filled 2017-11-20: qty 1

## 2017-11-20 MED ORDER — LIDOCAINE 2% (20 MG/ML) 5 ML SYRINGE
INTRAMUSCULAR | Status: AC
  Filled 2017-11-20: qty 15

## 2017-11-20 MED ORDER — FENTANYL CITRATE (PF) 100 MCG/2ML IJ SOLN
INTRAMUSCULAR | Status: DC | PRN
  Administered 2017-11-20: 50 ug via INTRAVENOUS

## 2017-11-20 MED ORDER — SENNA-DOCUSATE SODIUM 8.6-50 MG PO TABS: 2.0000 | ORAL_TABLET | Freq: Every day | ORAL | 1 refills | Status: DC

## 2017-11-20 MED ORDER — BUPIVACAINE IN DEXTROSE 0.75-8.25 % IT SOLN
INTRATHECAL | Status: DC | PRN
  Administered 2017-11-20: 13.5 mg via INTRATHECAL

## 2017-11-20 MED ORDER — ONDANSETRON HCL 4 MG PO TABS: 4.0000 mg | ORAL_TABLET | Freq: Three times a day (TID) | ORAL | 0 refills | Status: DC | PRN

## 2017-11-20 MED ORDER — POLYETHYLENE GLYCOL 3350 17 G PO PACK: 17.0000 g | PACK | Freq: Every day | ORAL | Status: DC | PRN

## 2017-11-20 MED ORDER — ZOLPIDEM TARTRATE 5 MG PO TABS
5.0000 mg | ORAL_TABLET | Freq: Every evening | ORAL | Status: DC | PRN
Start: 2017-11-20 — End: 2017-11-21

## 2017-11-20 MED ORDER — FENTANYL CITRATE (PF) 250 MCG/5ML IJ SOLN
INTRAMUSCULAR | Status: AC
  Filled 2017-11-20: qty 5

## 2017-11-20 MED ORDER — CALCIUM CARBONATE-VITAMIN D 500-200 MG-UNIT PO TABS
1.0000 | ORAL_TABLET | Freq: Every day | ORAL | Status: DC
  Administered 2017-11-20 – 2017-11-21 (×2): 1 via ORAL
  Filled 2017-11-20 (×2): qty 1

## 2017-11-20 MED ORDER — PROPOFOL 10 MG/ML IV BOLUS
INTRAVENOUS | Status: DC | PRN
  Administered 2017-11-20: 20 mg via INTRAVENOUS

## 2017-11-20 MED ORDER — MIDAZOLAM HCL 2 MG/2ML IJ SOLN: 0.5000 mg | Freq: Once | INTRAMUSCULAR | Status: DC | PRN

## 2017-11-20 MED ORDER — FLUOXETINE HCL 20 MG PO CAPS
20.0000 mg | ORAL_CAPSULE | Freq: Every day | ORAL | Status: DC
  Administered 2017-11-21: 20 mg via ORAL
  Filled 2017-11-20 (×2): qty 1

## 2017-11-20 MED ORDER — MIDAZOLAM HCL 2 MG/2ML IJ SOLN
INTRAMUSCULAR | Status: AC
  Filled 2017-11-20: qty 2

## 2017-11-20 MED ORDER — HYDROCODONE-ACETAMINOPHEN 10-325 MG PO TABS: 1.0000 | ORAL_TABLET | Freq: Four times a day (QID) | ORAL | 0 refills | Status: DC | PRN

## 2017-11-20 MED ORDER — CEFAZOLIN SODIUM-DEXTROSE 2-4 GM/100ML-% IV SOLN
2.0000 g | INTRAVENOUS | Status: AC
  Administered 2017-11-20: 2 g via INTRAVENOUS
  Filled 2017-11-20: qty 100

## 2017-11-20 MED ORDER — ASPIRIN EC 325 MG PO TBEC: 325.0000 mg | DELAYED_RELEASE_TABLET | Freq: Every day | ORAL | 0 refills | Status: DC

## 2017-11-20 MED ORDER — EPHEDRINE SULFATE 50 MG/ML IJ SOLN
INTRAMUSCULAR | Status: AC
  Filled 2017-11-20: qty 1

## 2017-11-20 MED ORDER — ONDANSETRON HCL 4 MG/2ML IJ SOLN
INTRAMUSCULAR | Status: DC | PRN
  Administered 2017-11-20: 4 mg via INTRAVENOUS

## 2017-11-20 MED ORDER — DEXAMETHASONE SODIUM PHOSPHATE 10 MG/ML IJ SOLN
INTRAMUSCULAR | Status: AC
  Filled 2017-11-20: qty 1

## 2017-11-20 MED ORDER — CLONAZEPAM 1 MG PO TABS
1.5000 mg | ORAL_TABLET | Freq: Every day | ORAL | Status: DC
  Administered 2017-11-20: 1.5 mg via ORAL
  Filled 2017-11-20: qty 1

## 2017-11-20 MED ORDER — MEPERIDINE HCL 50 MG/ML IJ SOLN: 6.2500 mg | INTRAMUSCULAR | Status: DC | PRN

## 2017-11-20 MED ORDER — METOCLOPRAMIDE HCL 5 MG PO TABS: 5.0000 mg | ORAL_TABLET | Freq: Three times a day (TID) | ORAL | Status: DC | PRN

## 2017-11-20 MED ORDER — ACETAMINOPHEN 500 MG PO TABS
500.0000 mg | ORAL_TABLET | Freq: Four times a day (QID) | ORAL | Status: DC
  Administered 2017-11-20 – 2017-11-21 (×3): 500 mg via ORAL
  Filled 2017-11-20 (×3): qty 1

## 2017-11-20 MED ORDER — SODIUM CHLORIDE 0.9 % IR SOLN
Status: DC | PRN
  Administered 2017-11-20: 1000 mL

## 2017-11-20 MED ORDER — HYDROMORPHONE HCL 1 MG/ML IJ SOLN: 0.2500 mg | INTRAMUSCULAR | Status: DC | PRN

## 2017-11-20 MED ORDER — METHOCARBAMOL 500 MG PO TABS
ORAL_TABLET | ORAL | Status: AC
  Filled 2017-11-20: qty 1

## 2017-11-20 MED ORDER — BISACODYL 10 MG RE SUPP: 10.0000 mg | Freq: Every day | RECTAL | Status: DC | PRN

## 2017-11-20 MED ORDER — POTASSIUM CHLORIDE IN NACL 20-0.45 MEQ/L-% IV SOLN
INTRAVENOUS | Status: DC
  Administered 2017-11-20 – 2017-11-21 (×2): via INTRAVENOUS
  Filled 2017-11-20 (×3): qty 1000

## 2017-11-20 MED ORDER — DOCUSATE SODIUM 100 MG PO CAPS
100.0000 mg | ORAL_CAPSULE | Freq: Two times a day (BID) | ORAL | Status: DC
  Administered 2017-11-21: 100 mg via ORAL
  Filled 2017-11-20: qty 1

## 2017-11-20 MED ORDER — ACETAMINOPHEN 500 MG PO TABS
1000.0000 mg | ORAL_TABLET | Freq: Once | ORAL | Status: AC
  Administered 2017-11-20: 1000 mg via ORAL
  Filled 2017-11-20: qty 2

## 2017-11-20 MED ORDER — SODIUM CHLORIDE 0.9 % IJ SOLN
INTRAMUSCULAR | Status: AC
  Filled 2017-11-20: qty 10

## 2017-11-20 MED ORDER — LIDOCAINE HCL (CARDIAC) PF 100 MG/5ML IV SOSY
PREFILLED_SYRINGE | INTRAVENOUS | Status: DC | PRN
  Administered 2017-11-20: 50 mg via INTRATRACHEAL

## 2017-11-20 MED ORDER — SODIUM CHLORIDE 0.9 % IV SOLN
INTRAVENOUS | Status: DC | PRN
  Administered 2017-11-20: 50 ug/min via INTRAVENOUS

## 2017-11-20 MED ORDER — KETOROLAC TROMETHAMINE 30 MG/ML IJ SOLN
INTRAMUSCULAR | Status: DC | PRN
  Administered 2017-11-20: 30 mg via INTRAVENOUS

## 2017-11-20 MED ORDER — PROPOFOL 10 MG/ML IV BOLUS
INTRAVENOUS | Status: AC
  Filled 2017-11-20: qty 20

## 2017-11-20 MED ORDER — METHOCARBAMOL 1000 MG/10ML IJ SOLN
500.0000 mg | Freq: Four times a day (QID) | INTRAVENOUS | Status: DC | PRN
  Filled 2017-11-20: qty 5

## 2017-11-20 MED ORDER — BUPIVACAINE HCL (PF) 0.25 % IJ SOLN
INTRAMUSCULAR | Status: AC
  Filled 2017-11-20: qty 30

## 2017-11-20 MED ORDER — GABAPENTIN 300 MG PO CAPS
300.0000 mg | ORAL_CAPSULE | Freq: Once | ORAL | Status: AC
  Administered 2017-11-20: 300 mg via ORAL
  Filled 2017-11-20: qty 1

## 2017-11-20 MED ORDER — BUPIVACAINE HCL (PF) 0.25 % IJ SOLN
INTRAMUSCULAR | Status: DC | PRN
  Administered 2017-11-20: 30 mL

## 2017-11-20 MED ORDER — ONDANSETRON HCL 4 MG PO TABS: 4.0000 mg | ORAL_TABLET | Freq: Four times a day (QID) | ORAL | Status: DC | PRN

## 2017-11-20 MED ORDER — DEXAMETHASONE SODIUM PHOSPHATE 10 MG/ML IJ SOLN
10.0000 mg | Freq: Once | INTRAMUSCULAR | Status: AC
  Administered 2017-11-21: 10 mg via INTRAVENOUS
  Filled 2017-11-20 (×2): qty 1

## 2017-11-20 MED ORDER — DIPHENHYDRAMINE HCL 12.5 MG/5ML PO ELIX: 12.5000 mg | ORAL_SOLUTION | ORAL | Status: DC | PRN

## 2017-11-20 MED ORDER — PROMETHAZINE HCL 25 MG/ML IJ SOLN: 6.2500 mg | INTRAMUSCULAR | Status: DC | PRN

## 2017-11-20 MED ORDER — ROPIVACAINE HCL 7.5 MG/ML IJ SOLN
INTRAMUSCULAR | Status: DC | PRN
  Administered 2017-11-20: 20 mL via PERINEURAL

## 2017-11-20 MED ORDER — VITAMIN D 1000 UNITS PO TABS
1000.0000 [IU] | ORAL_TABLET | Freq: Every day | ORAL | Status: DC
  Administered 2017-11-20 – 2017-11-21 (×2): 1000 [IU] via ORAL
  Filled 2017-11-20 (×2): qty 1

## 2017-11-20 MED ORDER — VITAMIN B-12 100 MCG PO TABS
50.0000 ug | ORAL_TABLET | Freq: Every day | ORAL | Status: DC
  Administered 2017-11-20 – 2017-11-21 (×2): 50 ug via ORAL
  Filled 2017-11-20 (×2): qty 1

## 2017-11-20 MED ORDER — HYDROCODONE-ACETAMINOPHEN 5-325 MG PO TABS
ORAL_TABLET | ORAL | Status: AC
  Filled 2017-11-20: qty 1

## 2017-11-20 MED ORDER — MIDAZOLAM HCL 5 MG/5ML IJ SOLN
INTRAMUSCULAR | Status: DC | PRN
  Administered 2017-11-20: 2 mg via INTRAVENOUS

## 2017-11-20 MED ORDER — PHENOL 1.4 % MT LIQD: 1.0000 | OROMUCOSAL | Status: DC | PRN

## 2017-11-20 MED ORDER — MORPHINE SULFATE (PF) 2 MG/ML IV SOLN: 0.5000 mg | INTRAVENOUS | Status: DC | PRN

## 2017-11-20 MED ORDER — PROPOFOL 500 MG/50ML IV EMUL
INTRAVENOUS | Status: DC | PRN
  Administered 2017-11-20: 100 ug/kg/min via INTRAVENOUS

## 2017-11-20 MED ORDER — ONDANSETRON HCL 4 MG/2ML IJ SOLN: 4.0000 mg | Freq: Four times a day (QID) | INTRAMUSCULAR | Status: DC | PRN

## 2017-11-20 MED ORDER — ONDANSETRON HCL 4 MG/2ML IJ SOLN
INTRAMUSCULAR | Status: AC
  Filled 2017-11-20: qty 6

## 2017-11-20 MED ORDER — LACTATED RINGERS IV SOLN
INTRAVENOUS | Status: DC | PRN
  Administered 2017-11-20: 07:00:00 via INTRAVENOUS

## 2017-11-20 MED ORDER — BACLOFEN 10 MG PO TABS: 10.0000 mg | ORAL_TABLET | Freq: Three times a day (TID) | ORAL | 0 refills | Status: DC

## 2017-11-20 MED ORDER — ALUM & MAG HYDROXIDE-SIMETH 200-200-20 MG/5ML PO SUSP: 30.0000 mL | ORAL | Status: DC | PRN

## 2017-11-20 MED ORDER — PANTOPRAZOLE SODIUM 40 MG PO TBEC
80.0000 mg | DELAYED_RELEASE_TABLET | Freq: Every day | ORAL | Status: DC
  Administered 2017-11-21: 80 mg via ORAL
  Filled 2017-11-20: qty 2

## 2017-11-20 MED ORDER — HYDROCODONE-ACETAMINOPHEN 7.5-325 MG PO TABS
1.0000 | ORAL_TABLET | ORAL | Status: DC | PRN
  Administered 2017-11-20: 2 via ORAL
  Filled 2017-11-20: qty 2

## 2017-11-20 SURGICAL SUPPLY — 58 items
BANDAGE ELASTIC 6 VELCRO ST LF (GAUZE/BANDAGES/DRESSINGS) ×3 IMPLANT
BANDAGE ESMARK 6X9 LF (GAUZE/BANDAGES/DRESSINGS) ×1 IMPLANT
BEARING MENISCAL TIBIAL 5 MD L (Orthopedic Implant) ×2 IMPLANT
BNDG CMPR 9X6 STRL LF SNTH (GAUZE/BANDAGES/DRESSINGS) ×1
BNDG CMPR MED 15X6 ELC VLCR LF (GAUZE/BANDAGES/DRESSINGS)
BNDG ELASTIC 6X15 VLCR STRL LF (GAUZE/BANDAGES/DRESSINGS) ×1 IMPLANT
BNDG ESMARK 6X9 LF (GAUZE/BANDAGES/DRESSINGS) ×3
BOWL SMART MIX CTS (DISPOSABLE) ×3 IMPLANT
BRNG TIB B UNCMP STRL LM/RL (Joint) ×1 IMPLANT
BRNG TIB MED 5 PHS 3 LT MEN (Orthopedic Implant) ×1 IMPLANT
CEMENT BONE R 1X40 (Cement) ×2 IMPLANT
CLOSURE STERI-STRIP 1/2X4 (GAUZE/BANDAGES/DRESSINGS) ×1
CLOSURE WOUND 1/2 X4 (GAUZE/BANDAGES/DRESSINGS)
CLSR STERI-STRIP ANTIMIC 1/2X4 (GAUZE/BANDAGES/DRESSINGS) ×2 IMPLANT
COVER SURGICAL LIGHT HANDLE (MISCELLANEOUS) ×3 IMPLANT
CUFF TOURNIQUET SINGLE 34IN LL (TOURNIQUET CUFF) ×3 IMPLANT
DRAPE EXTREMITY T 121X128X90 (DRAPE) IMPLANT
DRAPE HALF SHEET 40X57 (DRAPES) IMPLANT
DRAPE U-SHAPE 47X51 STRL (DRAPES) ×1 IMPLANT
DRSG MEPILEX BORDER 4X8 (GAUZE/BANDAGES/DRESSINGS) ×3 IMPLANT
DURAPREP 26ML APPLICATOR (WOUND CARE) ×5 IMPLANT
ELECT CAUTERY BLADE 6.4 (BLADE) ×3 IMPLANT
ELECT REM PT RETURN 9FT ADLT (ELECTROSURGICAL) ×3
ELECTRODE REM PT RTRN 9FT ADLT (ELECTROSURGICAL) ×1 IMPLANT
GLOVE BIOGEL PI ORTHO PRO SZ8 (GLOVE) ×4
GLOVE ORTHO TXT STRL SZ7.5 (GLOVE) ×3 IMPLANT
GLOVE PI ORTHO PRO STRL SZ8 (GLOVE) ×2 IMPLANT
GLOVE SURG ORTHO 8.0 STRL STRW (GLOVE) ×3 IMPLANT
GOWN STRL REUS W/ TWL XL LVL3 (GOWN DISPOSABLE) ×1 IMPLANT
GOWN STRL REUS W/TWL 2XL LVL3 (GOWN DISPOSABLE) ×3 IMPLANT
GOWN STRL REUS W/TWL XL LVL3 (GOWN DISPOSABLE) ×3
HANDPIECE INTERPULSE COAX TIP (DISPOSABLE) ×3
HOOD PEEL AWAY FACE SHEILD DIS (HOOD) ×3 IMPLANT
HOOD PEEL AWAY FLYTE STAYCOOL (MISCELLANEOUS) ×3 IMPLANT
IMMOBILIZER KNEE 22 UNIV (SOFTGOODS) ×3 IMPLANT
INSERT TIBIAL OXFORD SZ B LF (Joint) ×3 IMPLANT
KIT BASIN OR (CUSTOM PROCEDURE TRAY) ×3 IMPLANT
KIT TURNOVER KIT B (KITS) ×3 IMPLANT
MANIFOLD NEPTUNE II (INSTRUMENTS) ×3 IMPLANT
NDL HYPO 21X1.5 SAFETY (NEEDLE) IMPLANT
NEEDLE HYPO 21X1.5 SAFETY (NEEDLE) IMPLANT
NS IRRIG 1000ML POUR BTL (IV SOLUTION) ×3 IMPLANT
PACK BLADE SAW RECIP 70 3 PT (BLADE) ×2 IMPLANT
PACK TOTAL JOINT (CUSTOM PROCEDURE TRAY) ×3 IMPLANT
PAD ARMBOARD 7.5X6 YLW CONV (MISCELLANEOUS) ×6 IMPLANT
PEG TWIN FEM CEMENTED MED (Knees) ×2 IMPLANT
SET HNDPC FAN SPRY TIP SCT (DISPOSABLE) ×1 IMPLANT
STRIP CLOSURE SKIN 1/2X4 (GAUZE/BANDAGES/DRESSINGS) ×1 IMPLANT
SUCTION FRAZIER HANDLE 10FR (MISCELLANEOUS) ×2
SUCTION TUBE FRAZIER 10FR DISP (MISCELLANEOUS) ×1 IMPLANT
SUT VIC AB 0 CT1 27 (SUTURE) ×3
SUT VIC AB 0 CT1 27XBRD ANBCTR (SUTURE) ×1 IMPLANT
SUT VIC AB 1 CT1 27 (SUTURE) ×3
SUT VIC AB 1 CT1 27XBRD ANBCTR (SUTURE) ×1 IMPLANT
SUT VIC AB 3-0 SH 8-18 (SUTURE) ×3 IMPLANT
SYR CONTROL 10ML LL (SYRINGE) ×2 IMPLANT
TAPE STRIPS DRAPE STRL (GAUZE/BANDAGES/DRESSINGS) ×3 IMPLANT
TOWEL OR 17X26 10 PK STRL BLUE (TOWEL DISPOSABLE) ×3 IMPLANT

## 2017-11-20 NOTE — Discharge Instructions (Signed)

## 2017-11-20 NOTE — Evaluation (Signed)
Physical Therapy Evaluation Patient Details Name: Jane Brown MRN: [id number] DOB: Feb 15, 1956 Today's Date: 11/20/2017   History of Present Illness  Pt is a 62 y.o. female s/p elective L TKA on 11/20/17. PMH includes restless leg syndrome, depression.  Clinical Impression  Pt presents with an overall decrease in functional mobility secondary to above. PTA, pt indep; plans to stay with fiance at d/c who can provide 24/7 support. Educ on precautions, positioning, therex, and importance of mobility. Today, pt able to transfer and amb 150' with RW and min guard. Expect pt to progress well with mobility. Pt would benefit from continued acute PT services to maximize functional mobility and independence prior to return home.     Follow Up Recommendations Follow surgeon's recommendation for DC plan and follow-up therapies;Supervision for mobility/OOB    Equipment Recommendations  Rolling walker with 5" wheels;3in1 (PT)    Recommendations for Other Services       Precautions / Restrictions Precautions Precautions: Knee;Fall Precaution Comments: Verbally reviewed precautions Restrictions Weight Bearing Restrictions: Yes LLE Weight Bearing: Weight bearing as tolerated      Mobility  Bed Mobility Overal bed mobility: Modified Independent                Transfers Overall transfer level: Needs assistance Equipment used: Rolling walker (2 wheeled) Transfers: Sit to/from Stand Sit to Stand: Min guard         General transfer comment: Cues for correct hand placement  Ambulation/Gait Ambulation/Gait assistance: Min guard Gait Distance (Feet): 120 Feet Assistive device: Rolling walker (2 wheeled) Gait Pattern/deviations: Step-through pattern;Decreased stride length;Decreased weight shift to left;Antalgic Gait velocity: Decreased Gait velocity interpretation: <1.8 ft/sec, indicate of risk for recurrent falls General Gait Details: Cues for increased step length and heel-to-toe  gait pattern  Stairs            Wheelchair Mobility    Modified Rankin (Stroke Patients Only)       Balance Overall balance assessment: Needs assistance   Sitting balance-Leahy Scale: Fair       Standing balance-Leahy Scale: Poor Standing balance comment: Reliant on UE support                             Pertinent Vitals/Pain Pain Assessment: Faces Faces Pain Scale: Hurts a little bit Pain Location: L knee Pain Descriptors / Indicators: Grimacing;Sore Pain Intervention(s): Monitored during session;Ice applied    Home Living Family/patient expects to be discharged to:: Private residence Living Arrangements: Spouse/significant other(Fiance) Available Help at Discharge: Family;Available 24 hours/day(Fiance) Type of Home: House Home Access: Level entry     Home Layout: One level Home Equipment: None      Prior Function Level of Independence: Independent               Hand Dominance        Extremity/Trunk Assessment   Upper Extremity Assessment Upper Extremity Assessment: Overall WFL for tasks assessed    Lower Extremity Assessment Lower Extremity Assessment: LLE deficits/detail LLE Deficits / Details: Hip flexion 3/5, knee flex/ext 3/5 LLE: Unable to fully assess due to pain       Communication   Communication: No difficulties  Cognition Arousal/Alertness: Awake/alert Behavior During Therapy: WFL for tasks assessed/performed Overall Cognitive Status: Within Functional Limits for tasks assessed  General Comments General comments (skin integrity, edema, etc.): SpO2 94% on RA    Exercises Total Joint Exercises Ankle Circles/Pumps: AROM;Both;5 reps;Seated Long Arc Quad: AROM;Left;10 reps;Seated   Assessment/Plan    PT Assessment Patient needs continued PT services  PT Problem List Decreased strength;Decreased range of motion;Decreased activity tolerance;Decreased  balance;Decreased mobility;Decreased knowledge of use of DME;Decreased knowledge of precautions       PT Treatment Interventions DME instruction;Gait training;Stair training;Functional mobility training;Therapeutic activities;Therapeutic exercise;Balance training;Patient/family education    PT Goals (Current goals can be found in the Care Plan section)  Acute Rehab PT Goals Patient Stated Goal: "Get back home sooner rather than later" PT Goal Formulation: With patient Time For Goal Achievement: 12/04/17 Potential to Achieve Goals: Good    Frequency 7X/week   Barriers to discharge        Co-evaluation               AM-PAC PT "6 Clicks" Daily Activity  Outcome Measure Difficulty turning over in bed (including adjusting bedclothes, sheets and blankets)?: None Difficulty moving from lying on back to sitting on the side of the bed? : None Difficulty sitting down on and standing up from a chair with arms (e.g., wheelchair, bedside commode, etc,.)?: A Little Help needed moving to and from a bed to chair (including a wheelchair)?: A Little Help needed walking in hospital room?: A Little Help needed climbing 3-5 steps with a railing? : A Little 6 Click Score: 20    End of Session Equipment Utilized During Treatment: Gait belt Activity Tolerance: Patient tolerated treatment well Patient left: in chair;with call bell/phone within reach;with family/visitor present Nurse Communication: Mobility status PT Visit Diagnosis: Other abnormalities of gait and mobility (R26.89);Pain Pain - Right/Left: Left Pain - part of body: Knee    Time: 1610-9604 PT Time Calculation (min) (ACUTE ONLY): 29 min   Charges:   PT Evaluation $PT Eval Low Complexity: 1 Low PT Treatments $Gait Training: 8-22 mins   PT G Codes:       Mabeline Caras, PT, DPT Acute Rehab Services  Pager: [place] 11/20/2017, 5:36 PM

## 2017-11-20 NOTE — H&P (Signed)
PREOPERATIVE H&P  Chief Complaint: left knee pain  HPI: Jane Brown is a 62 y.o. female who presents for preoperative history and physical with a diagnosis of left knee osteoarthritis. Symptoms are rated as moderate to severe, and have been worsening.  This is significantly impairing activities of daily living.  She has elected for surgical management.   She has failed injections, activity modification, anti-inflammatories, and assistive devices.  Preoperative X-rays demonstrate end stage degenerative changes with osteophyte formation, loss of joint space, subchondral sclerosis.   Past Medical History:  Diagnosis Date  . Allergy   . Depression   . Family history of adverse reaction to anesthesia    mother has post-op nausea and vomiting  . Hemorrhoids   . Rectal bleeding   . Restless leg syndrome   . Wears dentures    No past surgical history on file. Social History   Socioeconomic History  . Marital status: Single    Spouse name: Not on file  . Number of children: Not on file  . Years of education: Not on file  . Highest education level: Not on file  Occupational History  . Not on file  Social Needs  . Financial resource strain: Not on file  . Food insecurity:    Worry: Not on file    Inability: Not on file  . Transportation needs:    Medical: Not on file    Non-medical: Not on file  Tobacco Use  . Smoking status: Former Research scientist (life sciences)  . Smokeless tobacco: Never Used  Substance and Sexual Activity  . Alcohol use: No  . Drug use: No  . Sexual activity: Not on file  Lifestyle  . Physical activity:    Days per week: Not on file    Minutes per session: Not on file  . Stress: Not on file  Relationships  . Social connections:    Talks on phone: Not on file    Gets together: Not on file    Attends religious service: Not on file    Active member of club or organization: Not on file    Attends meetings of clubs or organizations: Not on file    Relationship status: Not  on file  Other Topics Concern  . Not on file  Social History Narrative  . Not on file   Family History  Problem Relation Age of Onset  . Cancer Father        mouth cancer   Allergies  Allergen Reactions  . Penicillins Other (See Comments)    ALL cillin drugs!!!! Has patient had a PCN reaction causing immediate rash, facial/tongue/throat swelling, SOB or lightheadedness with hypotension: Yes Has patient had a PCN reaction causing severe rash involving mucus membranes or skin necrosis: No Has patient had a PCN reaction that required hospitalization: No Has patient had a PCN reaction occurring within the last 10 years: No If all of the above answers are "NO", then may proceed with Cephalosporin use.    Prior to Admission medications   Medication Sig Start Date End Date Taking? Authorizing Provider  CALCIUM PO Take 1 tablet by mouth daily.   Yes [provider]  Cholecalciferol (VITAMIN D3 PO) Take 1 capsule by mouth daily.   Yes [provider]  clonazePAM (KLONOPIN) 1 MG tablet Take 1.5 mg by mouth at bedtime.    Yes [provider]  Cyanocobalamin (VITAMIN B-12 PO) Take 1 tablet by mouth daily.   Yes [provider]  FLUoxetine (PROZAC) 20  MG capsule Take 20 mg by mouth daily.    Yes [provider]  ibuprofen (ADVIL,MOTRIN) 200 MG tablet Take 400 mg by mouth every 6 (six) hours as needed for headache or moderate pain.   Yes [provider]  omeprazole (PRILOSEC) 40 MG capsule Take 40 mg by mouth daily.   Yes [provider]     Positive ROS: All other systems have been reviewed and were otherwise negative with the exception of those mentioned in the HPI and as above.  Physical Exam:  Estimated body mass index is 36.43 kg/m as calculated from the following:   Height as of 11/09/17: 5\' 6"  (1.676 m).   Weight as of 11/09/17: 102.4 kg (225 lb 11.2 oz).   General: Alert, no acute distress Cardiovascular: No pedal  edema Respiratory: No cyanosis, no use of accessory musculature GI: No organomegaly, abdomen is soft and non-tender Skin: No lesions in the area of chief complaint Neurologic: Sensation intact distally Psychiatric: Patient is competent for consent with normal mood and affect Lymphatic: No axillary or cervical lymphadenopathy  MUSCULOSKELETAL: left knee 0-115 rom with varus and crepitance and painful arc  Assessment: Left knee oa   Plan: Plan for Procedure(s): UNICOMPARTMENTAL LEFT KNEE  The risks benefits and alternatives were discussed with the patient including but not limited to the risks of nonoperative treatment, versus surgical intervention including infection, bleeding, nerve injury,  blood clots, cardiopulmonary complications, morbidity, mortality, among others, and they were willing to proceed.    Patient's anticipated LOS is less than 2 midnights, meeting these requirements: - Younger than 62 - Lives within 1 hour of care - Has a competent adult at home to recover with post-op recover - NO history of  - Chronic pain requiring opiods  - Diabetes  - Coronary Artery Disease  - Heart failure  - Heart attack  - Stroke  - DVT/VTE  - Cardiac arrhythmia  - Respiratory Failure/COPD  - Renal failure  - Anemia  - Advanced Liver disease       Preoperative templating of the joint replacement has been completed, documented, and submitted to the Operating Room personnel in order to optimize intra-operative equipment management.  [name], MD Cell [phone]   11/20/2017 7:18 AM

## 2017-11-20 NOTE — Anesthesia Procedure Notes (Signed)
Anesthesia Regional Block: Adductor canal block   Pre-Anesthetic Checklist: ,, timeout performed, Correct Patient, Correct Site, Correct Laterality, Correct Procedure, Correct Position, site marked, Risks and benefits discussed,  Surgical consent,  Pre-op evaluation,  At surgeon's request and post-op pain management  Laterality: Left and Lower  Prep: chloraprep       Needles:  Injection technique: Single-shot  Needle Type: Echogenic Needle     Needle Length: 9cm  Needle Gauge: 21     Additional Needles:   Procedures:,,,, ultrasound used (permanent image in chart),,,,  Narrative:  Start time: 11/20/2017 7:19 AM End time: 11/20/2017 7:26 AM Injection made incrementally with aspirations every 5 mL.  Performed by: Personally  Anesthesiologist: Annye Asa, MD  Additional Notes: Pt identified in Holding room.  Monitors applied. Working IV access confirmed. Sterile prep, drape L thigh.  #21ga ECHOgenic needle into adductor canal with US guidance.  20cc 0.75% Ropivacaine injected incrementally after negative test dose.  Patient asymptomatic, VSS, no heme aspirated, tolerated well.  Jenita Seashore, MD

## 2017-11-20 NOTE — Anesthesia Procedure Notes (Signed)
Procedure Name: MAC Date/Time: 11/20/2017 7:35 AM Performed by: Neldon Newport, CRNA Pre-anesthesia Checklist: Timeout performed, Patient being monitored, Suction available, Emergency Drugs available and Patient identified Patient Re-evaluated:Patient Re-evaluated prior to induction Oxygen Delivery Method: Simple face mask

## 2017-11-20 NOTE — Plan of Care (Signed)
  Problem: Coping: Goal: Level of anxiety will decrease Outcome: Progressing   Problem: Pain Managment: Goal: General experience of comfort will improve Outcome: Progressing   

## 2017-11-20 NOTE — Anesthesia Postprocedure Evaluation (Signed)
Anesthesia Post Note  Patient: Jane Brown  Procedure(s) Performed: UNICOMPARTMENTAL LEFT KNEE (Left Knee)     Patient location during evaluation: PACU Anesthesia Type: Spinal and Regional Level of consciousness: awake and alert, oriented and patient cooperative Pain management: pain level controlled Vital Signs Assessment: post-procedure vital signs reviewed and stable Respiratory status: spontaneous breathing, nonlabored ventilation and respiratory function stable Cardiovascular status: blood pressure returned to baseline and stable Postop Assessment: spinal receding, patient able to bend at knees and no apparent nausea or vomiting Anesthetic complications: no    Last Vitals:  Vitals:   11/20/17 1333 11/20/17 1357  BP: 131/79 123/78  Pulse: 68 71  Resp: 16 17  Temp: 36.9 C   SpO2: 94% 96%    Last Pain:  Vitals:   11/20/17 1331  TempSrc:   PainSc: 4                  Mikalah Skyles,E. Lulabelle Desta

## 2017-11-20 NOTE — Op Note (Signed)
11/20/2017  9:09 AM  PATIENT:  Jane Brown    PRE-OPERATIVE DIAGNOSIS: Left knee primary localized osteoarthritis  POST-OPERATIVE DIAGNOSIS:  Same  PROCEDURE: Left unicompartmental Knee Arthroplasty  SURGEON:  [name], MD  PHYSICIAN ASSISTANT: Joya Gaskins, OPA-C, present and scrubbed throughout the case, critical for completion in a timely fashion, and for retraction, instrumentation, and closure.  ANESTHESIA:   Spinal with abductor canal block and intra-articular injection  ESTIMATED BLOOD LOSS: 75 mL  UNIQUE ASPECTS OF THE CASE: There is fairly substantial medial osteophyte formation.  She had a lot of wear both on the femur and the tibia.  I cut with a 2 mm shim, and it was a very thin cut, and yet still ended up with a 5.  The preparation on the tibia had just a little bit of toggle with swiveling of the component, that was resolved with the cement technique.  PREOPERATIVE INDICATIONS:  Jane Brown is a  62 y.o. female with a diagnosis of DJD LEFT KNEE who failed conservative measures and elected for surgical management.    The risks benefits and alternatives were discussed with the patient preoperatively including but not limited to the risks of infection, bleeding, nerve injury, cardiopulmonary complications, blood clots, the need for revision surgery, among others, and the patient was willing to proceed.  OPERATIVE IMPLANTS: Biomet [place] mobile bearing medial compartment arthroplasty femur size medium, tibia size B, bearing size 5.  OPERATIVE FINDINGS: Endstage grade 4 medial compartment osteoarthritis with significant eburnation. No significant changes in the lateral or patellofemoral joint, with the exception of grade 2 changes on the medial facet of the patella.  The ACL was intact.  OPERATIVE PROCEDURE: The patient was brought to the operating room placed in the supine position.  Spinal anesthesia was administered. IV antibiotics were given. The lower  extremity was placed in the legholder and prepped and draped in usual sterile fashion.  Time out was performed.  The leg was elevated and exsanguinated and the tourniquet was inflated. Anteromedial incision was performed, and I took care to preserve the MCL. Parapatellar incision was carried out, and the osteophytes were excised, along with the medial meniscus and a small portion of the fat pad.  The extra medullary tibial cutting jig was applied, using the spoon and the 27mm G-Clamp and the 2 mm shim, and I took care to protect the anterior cruciate ligament insertion and the tibial spine. The medial collateral ligament was also protected, and I resected my proximal tibia, matching the anatomic slope.   The proximal tibial bony cut was removed in one piece, and I turned my attention to the femur.  The intramedullary femoral rod was placed using the drill, and then using the appropriate reference, I assembled the femoral jig, setting my posterior cutting block. I resected my posterior femur, used the 0 spigot for the anterior femur, and then measured my gap.   I then used the appropriate mill to match the extension gap to the flexion gap. The second milling was at a 3.  The gaps were then measured again with the appropriate feeler gauges. Once I had balanced flexion and extension gaps, I then completed the preparation of the femur.  I milled off the anterior aspect of the distal femur to prevent impingement. I also exposed the tibia, and selected the above-named component, and then used the cutting jig to prepare the keel slot on the tibia. I also used the awl to curette out the bone to  complete the preparation of the keel. The back wall was intact.  I then placed trial components, and it was found to have excellent motion, and appropriate balance.  I then cemented the components into place, cementing the tibia first, removing all excess cement, and then cementing the femur.  All loose cement was  removed.  The real polyethylene insert was applied manually, and the knee was taken through functional range of motion, and found to have excellent stability and restoration of joint motion, with excellent balance.  The wounds were irrigated copiously, and the parapatellar tissue closed with Vicryl, followed by Vicryl for the subcutaneous tissue, with routine closure with Steri-Strips and sterile gauze.  The tourniquet was released, and the patient was awakened and extubated and returned to PACU in stable and satisfactory condition. There were no complications.

## 2017-11-20 NOTE — Anesthesia Procedure Notes (Signed)
Spinal  Patient location during procedure: OR End time: 11/20/2017 7:48 AM Staffing Anesthesiologist: Annye Asa, MD Performed: anesthesiologist  Preanesthetic Checklist Completed: patient identified, site marked, surgical consent, pre-op evaluation, timeout performed, IV checked, risks and benefits discussed and monitors and equipment checked Spinal Block Patient position: sitting Prep: site prepped and draped and DuraPrep Patient monitoring: blood pressure, continuous pulse ox, cardiac monitor and heart rate Approach: midline Location: L3-4 Injection technique: single-shot Needle Needle type: Pencan  Needle gauge: 24 G Needle length: 9 cm Additional Notes Pt identified in Operating room.  Monitors applied. Working IV access confirmed. Sterile prep, drape lumbar spine.  1% lido local L 2,3, but only os with Pencan, 1% lido local L 3,4.  #24ga Pencan into clear CSF L 3,4.  13.5mg  0.75% Bupivacaine with dextrose injected with asp CSF beginning and end of injection.  Patient asymptomatic, VSS, no heme aspirated, tolerated well.  Jenita Seashore, MD

## 2017-11-20 NOTE — Transfer of Care (Signed)
Immediate Anesthesia Transfer of Care Note  Patient: Jane Brown  Procedure(s) Performed: UNICOMPARTMENTAL LEFT KNEE (Left Knee)  Patient Location: PACU  Anesthesia Type:MAC  Level of Consciousness: awake, alert  and oriented  Airway & Oxygen Therapy: Patient Spontanous Breathing  Post-op Assessment: Report given to RN, Post -op Vital signs reviewed and stable and Patient moving all extremities X 4  Post vital signs: Reviewed and stable  Last Vitals:  Vitals Value Taken Time  BP    Temp    Pulse 75 11/20/2017  9:44 AM  Resp 22 11/20/2017  9:44 AM  SpO2 96 % 11/20/2017  9:44 AM  Vitals shown include unvalidated device data.  Last Pain:  Vitals:   11/20/17 0631  TempSrc:   PainSc: 5          Complications: No apparent anesthesia complications

## 2017-11-21 ENCOUNTER — Encounter (HOSPITAL_COMMUNITY): Payer: Self-pay

## 2017-11-21 ENCOUNTER — Other Ambulatory Visit: Payer: Self-pay

## 2017-11-21 DIAGNOSIS — G8929 Other chronic pain: Secondary | ICD-10-CM | POA: Diagnosis present

## 2017-11-21 DIAGNOSIS — Z6836 Body mass index (BMI) 36.0-36.9, adult: Secondary | ICD-10-CM | POA: Diagnosis not present

## 2017-11-21 DIAGNOSIS — I252 Old myocardial infarction: Secondary | ICD-10-CM | POA: Diagnosis not present

## 2017-11-21 DIAGNOSIS — K769 Liver disease, unspecified: Secondary | ICD-10-CM | POA: Diagnosis present

## 2017-11-21 DIAGNOSIS — I2510 Atherosclerotic heart disease of native coronary artery without angina pectoris: Secondary | ICD-10-CM | POA: Diagnosis present

## 2017-11-21 DIAGNOSIS — I509 Heart failure, unspecified: Secondary | ICD-10-CM | POA: Diagnosis present

## 2017-11-21 DIAGNOSIS — J449 Chronic obstructive pulmonary disease, unspecified: Secondary | ICD-10-CM | POA: Diagnosis present

## 2017-11-21 DIAGNOSIS — E119 Type 2 diabetes mellitus without complications: Secondary | ICD-10-CM | POA: Diagnosis present

## 2017-11-21 DIAGNOSIS — Z87891 Personal history of nicotine dependence: Secondary | ICD-10-CM | POA: Diagnosis not present

## 2017-11-21 DIAGNOSIS — Z96652 Presence of left artificial knee joint: Secondary | ICD-10-CM | POA: Diagnosis not present

## 2017-11-21 DIAGNOSIS — K219 Gastro-esophageal reflux disease without esophagitis: Secondary | ICD-10-CM | POA: Diagnosis present

## 2017-11-21 DIAGNOSIS — M1712 Unilateral primary osteoarthritis, left knee: Secondary | ICD-10-CM | POA: Diagnosis present

## 2017-11-21 DIAGNOSIS — Z79899 Other long term (current) drug therapy: Secondary | ICD-10-CM | POA: Diagnosis not present

## 2017-11-21 DIAGNOSIS — Z880 Allergy status to penicillin: Secondary | ICD-10-CM | POA: Diagnosis not present

## 2017-11-21 LAB — BASIC METABOLIC PANEL
Anion gap: 8 (ref 5–15)
BUN: 11 mg/dL (ref 6–20)
CO2: 23 mmol/L (ref 22–32)
Calcium: 8.9 mg/dL (ref 8.9–10.3)
Chloride: 107 mmol/L (ref 98–111)
Creatinine, Ser: 0.79 mg/dL (ref 0.44–1.00)
GFR calc Af Amer: >60 mL/min (ref >60)
Glucose, Bld: 166 mg/dL — ABNORMAL HIGH (ref 70–99)
Potassium: 4.9 mmol/L (ref 3.5–5.1)
Sodium: 138 mmol/L (ref 135–145)

## 2017-11-21 LAB — CBC
HCT: 37.6 % (ref 36.0–46.0)
Hemoglobin: 12 g/dL (ref 12.0–15.0)
MCH: 28.2 pg (ref 26.0–34.0)
MCHC: 31.9 g/dL (ref 30.0–36.0)
MCV: 88.5 fL (ref 78.0–100.0)
PLATELETS: 179 10*3/uL (ref 150–400)
RBC: 4.25 MIL/uL (ref 3.87–5.11)
RDW: 13.8 % (ref 11.5–15.5)
WBC: 11.4 10*3/uL — ABNORMAL HIGH (ref 4.0–10.5)

## 2017-11-21 NOTE — Care Management Note (Addendum)
Case Management Note  Patient Details  Name: Jane Brown MRN: [id number] Date of Birth: 1955/07/29  Subjective/Objective:   62 yr old female s/p left UNI knee arthroplasty,                 Action/Plan: Case manager spoke with patient concerning discharge plan and DME. Choice for [facility] was offered, referral for [facility] agency was called to Neoma Laming, [facility] Liaison. Patient will be going to recover from surgery at: [place]. [place], [place] [address].    Expected Discharge Date:  11/21/17               Expected Discharge Plan:  [facility]  In-House Referral:  NA  Discharge planning Services  CM Consult  Post Acute Care Choice:  Durable Medical Equipment, Home Health Choice offered to:  Patient  DME Arranged:  [facility] rolling DME Agency:  [employer]  HH Arranged:  PT [place]:  [facility]  Status of Service:  Completed, signed off  If discussed at [place] of Stay Meetings, dates discussed:    Additional Comments:  Ninfa Meeker, RN 11/21/2017, 2:24 PM

## 2017-11-21 NOTE — Progress Notes (Signed)
   11/21/17 1400  PT Time Calculation  PT Start Time (ACUTE ONLY) 1211  PT Stop Time (ACUTE ONLY) 1236  PT Time Calculation (min) (ACUTE ONLY) 25 min  PT General Charges  $$ ACUTE PT VISIT 1 Visit  PT Treatments  $Therapeutic Exercise 8-22 mins  $Therapeutic Activity 8-22 mins   [place], [place], [place] [phone]

## 2017-11-21 NOTE — Evaluation (Signed)
Occupational Therapy Evaluation and Discharge Patient Details Name: Jane Brown MRN: [id number] DOB: 08/01/1955 Today's Date: 11/21/2017    History of Present Illness Pt is a 62 y.o. female s/p elective L TKA on 11/20/17. PMH includes restless leg syndrome, depression.   Clinical Impression   Pt is functioning at a min assist level in ADL. All education completed at detailed below with pt and her fiance verbalizing understanding. No further OT needs.    Follow Up Recommendations  No OT follow up    Equipment Recommendations  None recommended by OT    Recommendations for Other Services       Precautions / Restrictions Precautions Precautions: Knee;Fall Restrictions Weight Bearing Restrictions: Yes LLE Weight Bearing: Weight bearing as tolerated      Mobility Bed Mobility               General bed mobility comments: pt in chair  Transfers Overall transfer level: Needs assistance Equipment used: Rolling walker (2 wheeled) Transfers: Sit to/from Stand Sit to Stand: Min guard         General transfer comment: increased time, good technique    Balance Overall balance assessment: Needs assistance   Sitting balance-Leahy Scale: Good       Standing balance-Leahy Scale: Fair Standing balance comment: can release walker in standing at sink                           ADL either performed or assessed with clinical judgement   ADL Overall ADL's : Needs assistance/impaired Eating/Feeding: Independent;Sitting   Grooming: Wash/dry hands;Standing;Min guard   Upper Body Bathing: Set up;Sitting   Lower Body Bathing: Minimal assistance;Sit to/from stand Lower Body Bathing Details (indicate cue type and reason): recommended long handled bath sponge Upper Body Dressing : Set up;Sitting   Lower Body Dressing: Minimal assistance;Sit to/from stand Lower Body Dressing Details (indicate cue type and reason): instructed in compensatory strategies Toilet  Transfer: Min guard;Ambulation;RW;Comfort height toilet   Toileting- Clothing Manipulation and Hygiene: Min guard;Sit to/from Nurse, children's Details (indicate cue type and reason): educated in technique with shower seat and with 3 in 1 Functional mobility during ADLs: Min guard;Rolling walker General ADL Comments: instructed in safe footwear, transporting items safely with RW     Vision Patient Visual Report: No change from baseline       Perception     Praxis      Pertinent Vitals/Pain Pain Assessment: Faces Faces Pain Scale: Hurts a little bit Pain Location: L knee Pain Descriptors / Indicators: Grimacing;Sore Pain Intervention(s): Monitored during session;Repositioned;Ice applied     Hand Dominance Right   Extremity/Trunk Assessment Upper Extremity Assessment Upper Extremity Assessment: Overall WFL for tasks assessed   Lower Extremity Assessment Lower Extremity Assessment: Defer to PT evaluation       Communication Communication Communication: No difficulties   Cognition Arousal/Alertness: Awake/alert Behavior During Therapy: WFL for tasks assessed/performed Overall Cognitive Status: Within Functional Limits for tasks assessed                                     General Comments       Exercises     Shoulder Instructions      Home Living Family/patient expects to be discharged to:: Private residence Living Arrangements: Spouse/significant other Available Help at Discharge: Family;Available 24 hours/day Type of Home: House  Home Access: Level entry     Home Layout: One level     Bathroom Shower/Tub: Teacher, early years/pre: Handicapped height     Home Equipment: None   Additional Comments: has access to a 3 in 1      Prior Functioning/Environment Level of Independence: Independent                 OT Problem List:        OT Treatment/Interventions:      OT Goals(Current goals can be found  in the care plan section) Acute Rehab OT Goals Patient Stated Goal: "Get back home sooner rather than later"  OT Frequency:     Barriers to D/C:            Co-evaluation              AM-PAC PT "6 Clicks" Daily Activity     Outcome Measure Help from another person eating meals?: None Help from another person taking care of personal grooming?: A Little Help from another person toileting, which includes using toliet, bedpan, or urinal?: A Little Help from another person bathing (including washing, rinsing, drying)?: A Little Help from another person to put on and taking off regular upper body clothing?: None Help from another person to put on and taking off regular lower body clothing?: A Little 6 Click Score: 20   End of Session Equipment Utilized During Treatment: Gait belt;Rolling walker  Activity Tolerance: Patient tolerated treatment well Patient left: in chair;with call bell/phone within reach;with family/visitor present  OT Visit Diagnosis: Other abnormalities of gait and mobility (R26.89)                Time: 6606-3016 OT Time Calculation (min): 18 min Charges:  OT General Charges $OT Visit: 1 Visit OT Evaluation $OT Eval Low Complexity: 1 Low G-Codes:     {[name], Haze [place] 11/21/2017, 9:59 AM  11/21/2017 Nestor Lewandowsky, OTR/L Pager: [phone]

## 2017-11-21 NOTE — Progress Notes (Signed)
Physical Therapy Treatment Patient Details Name: Jane Brown MRN: [id number] DOB: 1955-12-30 Today's Date: 11/21/2017    History of Present Illness Pt is a 62 y.o. female s/p elective L TKA on 11/20/17. PMH includes restless leg syndrome, depression.    PT Comments    Pt making steady progress with functional mobility. Pt would continue to benefit from skilled physical therapy services at this time while admitted and after d/c to address the below listed limitations in order to improve overall safety and independence with functional mobility.    Follow Up Recommendations  Home health PT;Supervision - Intermittent     Equipment Recommendations  Rolling walker with 5" wheels;3in1 (PT)    Recommendations for Other Services       Precautions / Restrictions Precautions Precautions: Knee;Fall Precaution Comments: Reviewed positioning precautions of LE following knee surgery with pt Restrictions Weight Bearing Restrictions: Yes LLE Weight Bearing: Weight bearing as tolerated    Mobility  Bed Mobility Overal bed mobility: Needs Assistance Bed Mobility: Supine to Sit     Supine to sit: Min guard     General bed mobility comments: increased time and effort, min guard for safety  Transfers Overall transfer level: Needs assistance Equipment used: Rolling walker (2 wheeled) Transfers: Sit to/from Stand Sit to Stand: Min guard         General transfer comment: pt demonstrated good technique, min guard for safety  Ambulation/Gait Ambulation/Gait assistance: Min guard Gait Distance (Feet): 150 Feet Assistive device: Rolling walker (2 wheeled) Gait Pattern/deviations: Step-through pattern;Decreased step length - right;Decreased step length - left;Decreased stride length;Decreased weight shift to left Gait velocity: Decreased Gait velocity interpretation: <1.31 ft/sec, indicative of household ambulator General Gait Details: pt steady with RW, no LOB or need for physical  assistance, min guard for safety. Pt progressively taking longer steps with R LE with practice   Stairs             Wheelchair Mobility    Modified Rankin (Stroke Patients Only)       Balance Overall balance assessment: Needs assistance Sitting-balance support: Feet supported Sitting balance-Leahy Scale: Good     Standing balance support: During functional activity;Bilateral upper extremity supported Standing balance-Leahy Scale: Poor Standing balance comment: can release walker in standing at sink                            Cognition Arousal/Alertness: Awake/alert Behavior During Therapy: WFL for tasks assessed/performed Overall Cognitive Status: Within Functional Limits for tasks assessed                                        Exercises Total Joint Exercises Quad Sets: AROM;Strengthening;Left;10 reps;Supine Heel Slides: AROM;Strengthening;Left;10 reps;Supine Hip ABduction/ADduction: AROM;Strengthening;Left;10 reps;Supine    General Comments        Pertinent Vitals/Pain Pain Assessment: Faces Faces Pain Scale: Hurts even more Pain Location: L knee Pain Descriptors / Indicators: Grimacing;Sore Pain Intervention(s): Monitored during session;Repositioned    Home Living Family/patient expects to be discharged to:: Private residence Living Arrangements: Spouse/significant other Available Help at Discharge: Family;Available 24 hours/day Type of Home: House Home Access: Level entry   Home Layout: One level Home Equipment: None Additional Comments: has access to a 3 in 1    Prior Function Level of Independence: Independent          PT Goals (current goals  can now be found in the care plan section) Acute Rehab PT Goals Patient Stated Goal: "Get back home sooner rather than later" PT Goal Formulation: With patient Time For Goal Achievement: 12/04/17 Potential to Achieve Goals: Good Progress towards PT goals: Progressing  toward goals    Frequency    7X/week      PT Plan Current plan remains appropriate    Co-evaluation              AM-PAC PT "6 Clicks" Daily Activity  Outcome Measure  Difficulty turning over in bed (including adjusting bedclothes, sheets and blankets)?: None Difficulty moving from lying on back to sitting on the side of the bed? : None Difficulty sitting down on and standing up from a chair with arms (e.g., wheelchair, bedside commode, etc,.)?: Unable Help needed moving to and from a bed to chair (including a wheelchair)?: A Little Help needed walking in hospital room?: A Little Help needed climbing 3-5 steps with a railing? : A Little 6 Click Score: 18    End of Session Equipment Utilized During Treatment: Gait belt Activity Tolerance: Patient tolerated treatment well Patient left: in chair;with call bell/phone within reach Nurse Communication: Mobility status PT Visit Diagnosis: Other abnormalities of gait and mobility (R26.89);Pain Pain - Right/Left: Left Pain - part of body: Knee     Time: 5498-2641 PT Time Calculation (min) (ACUTE ONLY): 25 min  Charges:  $Gait Training: 8-22 mins $Therapeutic Activity: 8-22 mins                    G Codes:       [place], [place], [place] 11/21/2017, 10:19 AM

## 2017-11-21 NOTE — Progress Notes (Signed)
Physical Therapy Treatment Patient Details Name: Jane Brown MRN: [id number] DOB: Nov 17, 1955 Today's Date: 11/21/2017    History of Present Illness Pt is a 62 y.o. female s/p elective L TKA on 11/20/17. PMH includes restless leg syndrome, depression.    PT Comments    Pt continues to progress towards achieving current goals, plans to discharge later today with family and is ready to D/C from a PT perspective.    Follow Up Recommendations  Home health PT;Supervision - Intermittent     Equipment Recommendations  Rolling walker with 5" wheels;3in1 (PT)    Recommendations for Other Services       Precautions / Restrictions Precautions Precautions: Knee;Fall Precaution Comments: Reviewed positioning precautions of LE following knee surgery with pt Restrictions Weight Bearing Restrictions: Yes LLE Weight Bearing: Weight bearing as tolerated    Mobility  Bed Mobility Overal bed mobility: Needs Assistance Bed Mobility: Supine to Sit;Sit to Supine     Supine to sit: Supervision Sit to supine: Supervision   General bed mobility comments: Pt used bedrail to help assist getting back in bed from sitting EOB and for adjustments in bed with increased time due to pain   Transfers Overall transfer level: Needs assistance Equipment used: Rolling walker (2 wheeled) Transfers: Sit to/from Stand Sit to Stand: Min guard         General transfer comment: Pt transfered EOB to standing using walker with decreased verbal cues needed  Ambulation/Gait             General Gait Details: focus of the session was on LE ther ex as patient ambulated during earlier session   Stairs             Wheelchair Mobility    Modified Rankin (Stroke Patients Only)       Balance Overall balance assessment: Needs assistance Sitting-balance support: Feet supported Sitting balance-Leahy Scale: Good     Standing balance support: During functional activity;Bilateral upper extremity  supported Standing balance-Leahy Scale: Poor Standing balance comment: Increased trunk sway with LE ther ex with UE support                            Cognition Arousal/Alertness: Awake/alert Behavior During Therapy: WFL for tasks assessed/performed Overall Cognitive Status: Within Functional Limits for tasks assessed                                        Exercises Total Joint Exercises Quad Sets: AROM;Strengthening;10 reps;Left;Standing( Terminal knee extension) Long Arc Quad: AROM;Strengthening;Left;10 reps Knee Flexion: AROM;Strengthening;Left;10 reps;Seated;Standing Goniometric ROM: Flexion= 76 degrees; Extension= lacking 31 degrees; measured in sitting  Standing Hip Extension: AROM;Strengthening;10 reps;Left General Exercises - Lower Extremity Mini-Sqauts: AROM;Strengthening;Left;10 reps    General Comments        Pertinent Vitals/Pain Pain Assessment: 0-10 Pain Score: 3  Pain Location: L knee Pain Descriptors / Indicators: Grimacing;Sore Pain Intervention(s): Monitored during session;Repositioned    Home Living                      Prior Function            PT Goals (current goals can now be found in the care plan section) Acute Rehab PT Goals PT Goal Formulation: With patient Time For Goal Achievement: 12/04/17 Potential to Achieve Goals: Good Progress towards PT goals: Progressing toward goals  Frequency    7X/week      PT Plan Current plan remains appropriate    Co-evaluation              AM-PAC PT "6 Clicks" Daily Activity  Outcome Measure  Difficulty turning over in bed (including adjusting bedclothes, sheets and blankets)?: None Difficulty moving from lying on back to sitting on the side of the bed? : None Difficulty sitting down on and standing up from a chair with arms (e.g., wheelchair, bedside commode, etc,.)?: Unable Help needed moving to and from a bed to chair (including a wheelchair)?: A  Little Help needed walking in hospital room?: A Little Help needed climbing 3-5 steps with a railing? : A Little 6 Click Score: 18    End of Session   Activity Tolerance: Patient tolerated treatment well Patient left: in bed;with call bell/phone within reach;with family/visitor present Nurse Communication: Mobility status PT Visit Diagnosis: Other abnormalities of gait and mobility (R26.89);Muscle weakness (generalized) (M62.81);Pain Pain - Right/Left: Left Pain - part of body: Knee     Time: 1211-1236 PT Time Calculation (min) (ACUTE ONLY): 25 min  Charges:                       G Codes:       Einar Crow, SPT  Student Physical Therapist Acute Rehab [phone]    Einar Crow 11/21/2017, 2:19 PM

## 2017-11-21 NOTE — Discharge Summary (Signed)
Physician Discharge Summary  Patient ID: Jane Brown MRN: [id number] DOB/AGE: [birth year] 62 y.o.  Admit date: 11/20/2017 Discharge date: 11/21/2017  Admission Diagnoses:  Primary localized osteoarthritis of left knee  Discharge Diagnoses:  Principal Problem:   Primary localized osteoarthritis of left knee Active Problems:   S/P knee replacement   Past Medical History:  Diagnosis Date  . Allergy   . Depression   . Family history of adverse reaction to anesthesia    mother has post-op nausea and vomiting  . Hemorrhoids   . Primary localized osteoarthritis of left knee 11/20/2017  . Rectal bleeding   . Restless leg syndrome   . Wears dentures     Surgeries: Procedure(s): UNICOMPARTMENTAL LEFT KNEE on 11/20/2017   Consultants (if any):   Discharged Condition: Improved  Hospital Course: Jane Brown is an 62 y.o. female who was admitted 11/20/2017 with a diagnosis of Primary localized osteoarthritis of left knee and went to the operating room on 11/20/2017 and underwent the above named procedures.    She was given perioperative antibiotics:  Anti-infectives (From admission, onward)   Start     Dose/Rate Route Frequency Ordered Stop   11/20/17 0600  ceFAZolin (ANCEF) IVPB 2g/100 mL premix     2 g 200 mL/hr over 30 Minutes Intravenous On call to O.R. 11/20/17 0559 11/20/17 0347    .  She was given sequential compression devices, early ambulation, and aspirin for DVT prophylaxis.  She benefited maximally from the hospital stay and there were no complications.    Recent vital signs:  Vitals:   11/20/17 2348 11/21/17 0425  BP: 110/65 119/70  Pulse: 63 (!) 59  Resp:  14  Temp: 97.8 F (36.6 C) 97.9 F (36.6 C)  SpO2: 92% 94%    Recent laboratory studies:  Lab Results  Component Value Date   HGB 12.0 11/21/2017   HGB 14.0 11/09/2017   Lab Results  Component Value Date   WBC 11.4 (H) 11/21/2017   PLT 179 11/21/2017   No results found for: INR Lab  Results  Component Value Date   NA 138 11/21/2017   K 4.9 11/21/2017   CL 107 11/21/2017   CO2 23 11/21/2017   BUN 11 11/21/2017   CREATININE 0.79 11/21/2017   GLUCOSE 166 (H) 11/21/2017    Discharge Medications:   Allergies as of 11/21/2017      Reactions   Penicillins Other (See Comments)   ALL cillin drugs!!!! Has patient had a PCN reaction causing immediate rash, facial/tongue/throat swelling, SOB or lightheadedness with hypotension: Yes Has patient had a PCN reaction causing severe rash involving mucus membranes or skin necrosis: No Has patient had a PCN reaction that required hospitalization: No Has patient had a PCN reaction occurring within the last 10 years: No If all of the above answers are "NO", then may proceed with Cephalosporin use.      Medication List    TAKE these medications   aspirin EC 325 MG tablet Take 1 tablet (325 mg total) by mouth daily.   baclofen 10 MG tablet Commonly known as:  LIORESAL Take 1 tablet (10 mg total) by mouth 3 (three) times daily. As needed for muscle spasm   CALCIUM PO Take 1 tablet by mouth daily.   clonazePAM 1 MG tablet Commonly known as:  KLONOPIN Take 1.5 mg by mouth at bedtime.   FLUoxetine 20 MG capsule Commonly known as:  PROZAC Take 20 mg by mouth daily.   HYDROcodone-acetaminophen 10-325 MG  tablet Commonly known as:  NORCO Take 1 tablet by mouth every 6 (six) hours as needed.   ibuprofen 200 MG tablet Commonly known as:  ADVIL,MOTRIN Take 400 mg by mouth every 6 (six) hours as needed for headache or moderate pain.   omeprazole 40 MG capsule Commonly known as:  PRILOSEC Take 40 mg by mouth daily.   ondansetron 4 MG tablet Commonly known as:  ZOFRAN Take 1 tablet (4 mg total) by mouth every 8 (eight) hours as needed for nausea or vomiting.   sennosides-docusate sodium 8.6-50 MG tablet Commonly known as:  SENOKOT-S Take 2 tablets by mouth daily.   VITAMIN B-12 PO Take 1 tablet by mouth daily.    VITAMIN D3 PO Take 1 capsule by mouth daily.       Diagnostic Studies: Dg Knee Left Port  Result Date: 11/20/2017 CLINICAL DATA:  Status post left partial knee joint replacement. EXAM: PORTABLE LEFT KNEE - 1-2 VIEW COMPARISON:  None in PACs FINDINGS: The patient has undergone partial knee replacement involving the medial compartment. Radiographic positioning of the prosthetic components is good. The lateral and patellofemoral compartments exhibit mild degenerative changes. IMPRESSION: No immediate complication following unicompartmental left knee joint replacement. Electronically Signed   By: David  Martinique M.D.   On: 11/20/2017 10:00    Disposition:     Follow-up Information    [name], MD. Schedule an appointment as soon as possible for a visit in 2 weeks.   Specialty:  Orthopedic Surgery Contact information: [address] [place] [address] [phone]            Signed: [name] 11/21/2017, 8:40 AM

## 2017-11-21 NOTE — Care Management Obs Status (Signed)
[facility] NOTIFICATION   Patient Details  Name: Jane Brown MRN: [id number] Date of Birth: 05/10/1955   Medicare Observation Status Notification Given:  Yes    Ninfa Meeker, RN 11/21/2017, 2:19 PM

## 2017-11-21 NOTE — Progress Notes (Signed)
Discharge instructions completed with pt. Pt verbalized understanding of the information.  Pt denies chest pain, shortness of breath, dizziness, lightheadedness, and n/v.  Pt discharged home.  

## 2017-12-03 DIAGNOSIS — M1712 Unilateral primary osteoarthritis, left knee: Secondary | ICD-10-CM | POA: Diagnosis not present

## 2018-01-02 DIAGNOSIS — M1712 Unilateral primary osteoarthritis, left knee: Secondary | ICD-10-CM | POA: Diagnosis not present

## 2018-01-14 DIAGNOSIS — Z23 Encounter for immunization: Secondary | ICD-10-CM | POA: Diagnosis not present

## 2018-01-14 DIAGNOSIS — R8290 Unspecified abnormal findings in urine: Secondary | ICD-10-CM | POA: Diagnosis not present

## 2018-01-14 DIAGNOSIS — G2581 Restless legs syndrome: Secondary | ICD-10-CM | POA: Diagnosis not present

## 2018-01-14 DIAGNOSIS — I10 Essential (primary) hypertension: Secondary | ICD-10-CM | POA: Diagnosis not present

## 2018-01-14 DIAGNOSIS — F330 Major depressive disorder, recurrent, mild: Secondary | ICD-10-CM | POA: Diagnosis not present

## 2018-02-04 DIAGNOSIS — M170 Bilateral primary osteoarthritis of knee: Secondary | ICD-10-CM | POA: Diagnosis not present

## 2018-02-05 DIAGNOSIS — I10 Essential (primary) hypertension: Secondary | ICD-10-CM | POA: Diagnosis not present

## 2018-02-20 ENCOUNTER — Other Ambulatory Visit: Payer: Self-pay | Admitting: Orthopedic Surgery

## 2018-03-05 NOTE — Patient Instructions (Signed)
Jane Brown  03/05/2018   Your procedure is scheduled on: 03-19-18  Report to [facility] Brown Main  Entrance  Report to admitting at      0530 AM     Call this number if you have problems the morning of surgery [phone]    Remember: Do not eat food or drink liquids :After Midnight. BRUSH YOUR TEETH MORNING OF SURGERY AND RINSE YOUR MOUTH OUT, NO CHEWING GUM CANDY OR MINTS.     Take these medicines the morning of surgery with A SIP OF WATER: omeprazole , prozac               You may not have any metal on your body including hair pins and              piercings  Do not wear jewelry, make-up, lotions, powders or perfumes, deodorant             Do not wear nail polish.  Do not shave  48 hours prior to surgery.       Do not bring valuables to the [facility]. Time.  Contacts, dentures or bridgework may not be worn into surgery.  Leave suitcase in the car. After surgery it may be brought to your room.                  Please read over the following fact sheets you were given: _____________________________________________________________________           Jane Brown - Preparing for Surgery Before surgery, you can play an important role.  Because skin is not sterile, your skin needs to be as free of germs as possible.  You can reduce the number of germs on your skin by washing with CHG (chlorahexidine gluconate) soap before surgery.  CHG is an antiseptic cleaner which kills germs and bonds with the skin to continue killing germs even after washing. Please DO NOT use if you have an allergy to CHG or antibacterial soaps.  If your skin becomes reddened/irritated stop using the CHG and inform your nurse when you arrive at Short Stay. Do not shave (including legs and underarms) for at least 48 hours prior to the first CHG shower.  You may shave your face/neck. Please follow these instructions carefully:  1.   Shower with CHG Soap the night before surgery and the  morning of Surgery.  2.  If you choose to wash your hair, wash your hair first as usual with your  normal  shampoo.  3.  After you shampoo, rinse your hair and body thoroughly to remove the  shampoo.                           4.  Use CHG as you would any other liquid soap.  You can apply chg directly  to the skin and wash                       Gently with a scrungie or clean washcloth.  5.  Apply the CHG Soap to your body ONLY FROM THE NECK DOWN.   Do not use on face/ open  Wound or open sores. Avoid contact with eyes, ears mouth and genitals (private parts).                       Wash face,  Genitals (private parts) with your normal soap.             6.  Wash thoroughly, paying special attention to the area where your surgery  will be performed.  7.  Thoroughly rinse your body with warm water from the neck down.  8.  DO NOT shower/wash with your normal soap after using and rinsing off  the CHG Soap.                9.  Pat yourself dry with a clean towel.            10.  Wear clean pajamas.            11.  Place clean sheets on your bed the night of your first shower and do not  sleep with pets. Day of Surgery : Do not apply any lotions/deodorants the morning of surgery.  Please wear clean clothes to the Brown/surgery center.  FAILURE TO FOLLOW THESE INSTRUCTIONS MAY RESULT IN THE CANCELLATION OF YOUR SURGERY PATIENT SIGNATURE_________________________________  NURSE SIGNATURE__________________________________  ________________________________________________________________________   Jane Brown  An incentive spirometer is a tool that can help keep your lungs clear and active. This tool measures how well you are filling your lungs with each breath. Taking long deep breaths may help reverse or decrease the chance of developing breathing (pulmonary) problems (especially infection) following:  A long  period of time when you are unable to move or be active. BEFORE THE PROCEDURE   If the spirometer includes an indicator to show your best effort, your nurse or respiratory therapist will set it to a desired goal.  If possible, sit up straight or lean slightly forward. Try not to slouch.  Hold the incentive spirometer in an upright position. INSTRUCTIONS FOR USE  1. Sit on the edge of your bed if possible, or sit up as far as you can in bed or on a chair. 2. Hold the incentive spirometer in an upright position. 3. Breathe out normally. 4. Place the mouthpiece in your mouth and seal your lips tightly around it. 5. Breathe in slowly and as deeply as possible, raising the piston or the ball toward the top of the column. 6. Hold your breath for 3-5 seconds or for as long as possible. Allow the piston or ball to fall to the bottom of the column. 7. Remove the mouthpiece from your mouth and breathe out normally. 8. Rest for a few seconds and repeat Steps 1 through 7 at least 10 times every 1-2 hours when you are awake. Take your time and take a few normal breaths between deep breaths. 9. The spirometer may include an indicator to show your best effort. Use the indicator as a goal to work toward during each repetition. 10. After each set of 10 deep breaths, practice coughing to be sure your lungs are clear. If you have an incision (the cut made at the time of surgery), support your incision when coughing by placing a pillow or rolled up towels firmly against it. Once you are able to get out of bed, walk around indoors and cough well. You may stop using the incentive spirometer when instructed by your caregiver.  RISKS AND COMPLICATIONS  Take your time so you do not get  dizzy or light-headed.  If you are in pain, you may need to take or ask for pain medication before doing incentive spirometry. It is harder to take a deep breath if you are having pain. AFTER USE  Rest and breathe slowly and  easily.  It can be helpful to keep track of a log of your progress. Your caregiver can provide you with a simple table to help with this. If you are using the spirometer at home, follow these instructions: Jane Brown IF:   You are having difficultly using the spirometer.  You have trouble using the spirometer as often as instructed.  Your pain medication is not giving enough relief while using the spirometer.  You develop fever of 100.5 F (38.1 C) or higher. SEEK IMMEDIATE MEDICAL CARE IF:   You cough up bloody sputum that had not been present before.  You develop fever of 102 F (38.9 C) or greater.  You develop worsening pain at or near the incision site. MAKE SURE YOU:   Understand these instructions.  Will watch your condition.  Will get help right away if you are not doing well or get worse. Document Released: 08/28/2006 Document Revised: 07/10/2011 Document Reviewed: 10/29/2006 [facility] Patient Information 2014 [place], [place].   ________________________________________________________________________

## 2018-03-12 ENCOUNTER — Encounter (HOSPITAL_COMMUNITY)
Admission: RE | Admit: 2018-03-12 | Discharge: 2018-03-12 | Disposition: A | Discharge status: Home / Self Care | Payer: Medicare HMO | Source: Ambulatory Visit | Attending: Orthopedic Surgery | Admitting: Orthopedic Surgery

## 2018-03-12 ENCOUNTER — Other Ambulatory Visit: Payer: Self-pay

## 2018-03-12 ENCOUNTER — Encounter (HOSPITAL_COMMUNITY): Payer: Self-pay

## 2018-03-12 DIAGNOSIS — M13861 Other specified arthritis, right knee: Secondary | ICD-10-CM | POA: Insufficient documentation

## 2018-03-12 DIAGNOSIS — Z01818 Encounter for other preprocedural examination: Secondary | ICD-10-CM | POA: Insufficient documentation

## 2018-03-12 HISTORY — DX: Gastro-esophageal reflux disease without esophagitis: K21.9

## 2018-03-12 HISTORY — DX: Other specified postprocedural states: Z98.890

## 2018-03-12 HISTORY — DX: Essential (primary) hypertension: I10

## 2018-03-12 HISTORY — DX: Anxiety disorder, unspecified: F41.9

## 2018-03-12 HISTORY — DX: Nausea with vomiting, unspecified: R11.2

## 2018-03-12 LAB — CBC
HCT: 42.2 % (ref 36.0–46.0)
HEMOGLOBIN: 13.6 g/dL (ref 12.0–15.0)
MCH: 27.7 pg (ref 26.0–34.0)
MCHC: 32.2 g/dL (ref 30.0–36.0)
MCV: 85.9 fL (ref 80.0–100.0)
Platelets: 203 10*3/uL (ref 150–400)
RBC: 4.91 MIL/uL (ref 3.87–5.11)
RDW: 12.9 % (ref 11.5–15.5)
WBC: 5.6 10*3/uL (ref 4.0–10.5)
nRBC: 0 % (ref 0.0–0.2)

## 2018-03-12 LAB — BASIC METABOLIC PANEL
ANION GAP: 9 (ref 5–15)
BUN: 16 mg/dL (ref 8–23)
CHLORIDE: 105 mmol/L (ref 98–111)
CO2: 25 mmol/L (ref 22–32)
Calcium: 9.5 mg/dL (ref 8.9–10.3)
Creatinine, Ser: 0.96 mg/dL (ref 0.44–1.00)
GFR calc Af Amer: >60 mL/min (ref >60)
GLUCOSE: 97 mg/dL (ref 70–99)
POTASSIUM: 4.1 mmol/L (ref 3.5–5.1)
Sodium: 139 mmol/L (ref 135–145)

## 2018-03-12 LAB — SURGICAL PCR SCREEN
MRSA, PCR: NEGATIVE
STAPHYLOCOCCUS AUREUS: POSITIVE — AB

## 2018-03-12 NOTE — Progress Notes (Signed)
ekg 10-10-17 chart

## 2018-03-19 ENCOUNTER — Encounter (HOSPITAL_COMMUNITY)
Admission: RE | Disposition: A | Discharge status: Home / Self Care | Payer: Self-pay | Source: Other Acute Inpatient Hospital | Attending: Orthopedic Surgery

## 2018-03-19 ENCOUNTER — Ambulatory Visit (HOSPITAL_COMMUNITY): Payer: Medicare HMO | Admitting: Anesthesiology

## 2018-03-19 ENCOUNTER — Encounter (HOSPITAL_COMMUNITY): Payer: Self-pay | Admitting: *Deleted

## 2018-03-19 ENCOUNTER — Other Ambulatory Visit: Payer: Self-pay

## 2018-03-19 ENCOUNTER — Observation Stay (HOSPITAL_COMMUNITY)
Admission: RE | Admit: 2018-03-19 | Discharge: 2018-03-20 | Disposition: A | Discharge status: Home / Self Care | Payer: Medicare HMO | Source: Other Acute Inpatient Hospital | Attending: Orthopedic Surgery | Admitting: Orthopedic Surgery

## 2018-03-19 ENCOUNTER — Observation Stay (HOSPITAL_COMMUNITY): Payer: Medicare HMO

## 2018-03-19 DIAGNOSIS — Z87891 Personal history of nicotine dependence: Secondary | ICD-10-CM | POA: Diagnosis not present

## 2018-03-19 DIAGNOSIS — G8918 Other acute postprocedural pain: Secondary | ICD-10-CM | POA: Diagnosis not present

## 2018-03-19 DIAGNOSIS — Z882 Allergy status to sulfonamides status: Secondary | ICD-10-CM | POA: Diagnosis not present

## 2018-03-19 DIAGNOSIS — Z96659 Presence of unspecified artificial knee joint: Secondary | ICD-10-CM

## 2018-03-19 DIAGNOSIS — Z885 Allergy status to narcotic agent status: Secondary | ICD-10-CM | POA: Insufficient documentation

## 2018-03-19 DIAGNOSIS — K219 Gastro-esophageal reflux disease without esophagitis: Secondary | ICD-10-CM | POA: Insufficient documentation

## 2018-03-19 DIAGNOSIS — E669 Obesity, unspecified: Secondary | ICD-10-CM | POA: Insufficient documentation

## 2018-03-19 DIAGNOSIS — G2581 Restless legs syndrome: Secondary | ICD-10-CM | POA: Insufficient documentation

## 2018-03-19 DIAGNOSIS — Z791 Long term (current) use of non-steroidal anti-inflammatories (NSAID): Secondary | ICD-10-CM | POA: Insufficient documentation

## 2018-03-19 DIAGNOSIS — Z6835 Body mass index (BMI) 35.0-35.9, adult: Secondary | ICD-10-CM | POA: Insufficient documentation

## 2018-03-19 DIAGNOSIS — F419 Anxiety disorder, unspecified: Secondary | ICD-10-CM | POA: Insufficient documentation

## 2018-03-19 DIAGNOSIS — Z7982 Long term (current) use of aspirin: Secondary | ICD-10-CM | POA: Diagnosis not present

## 2018-03-19 DIAGNOSIS — Z888 Allergy status to other drugs, medicaments and biological substances status: Secondary | ICD-10-CM | POA: Insufficient documentation

## 2018-03-19 DIAGNOSIS — Z79899 Other long term (current) drug therapy: Secondary | ICD-10-CM | POA: Diagnosis not present

## 2018-03-19 DIAGNOSIS — M1711 Unilateral primary osteoarthritis, right knee: Secondary | ICD-10-CM | POA: Diagnosis not present

## 2018-03-19 DIAGNOSIS — I110 Hypertensive heart disease with heart failure: Secondary | ICD-10-CM | POA: Diagnosis not present

## 2018-03-19 DIAGNOSIS — Z96652 Presence of left artificial knee joint: Secondary | ICD-10-CM | POA: Insufficient documentation

## 2018-03-19 DIAGNOSIS — Z96651 Presence of right artificial knee joint: Secondary | ICD-10-CM | POA: Diagnosis not present

## 2018-03-19 DIAGNOSIS — I10 Essential (primary) hypertension: Secondary | ICD-10-CM | POA: Diagnosis not present

## 2018-03-19 DIAGNOSIS — I509 Heart failure, unspecified: Secondary | ICD-10-CM | POA: Diagnosis not present

## 2018-03-19 DIAGNOSIS — Z880 Allergy status to penicillin: Secondary | ICD-10-CM | POA: Insufficient documentation

## 2018-03-19 DIAGNOSIS — F329 Major depressive disorder, single episode, unspecified: Secondary | ICD-10-CM | POA: Diagnosis not present

## 2018-03-19 DIAGNOSIS — Z471 Aftercare following joint replacement surgery: Secondary | ICD-10-CM | POA: Diagnosis not present

## 2018-03-19 HISTORY — DX: Unilateral primary osteoarthritis, right knee: M17.11

## 2018-03-19 HISTORY — PX: PARTIAL KNEE ARTHROPLASTY: SHX2174

## 2018-03-19 SURGERY — ARTHROPLASTY, KNEE, UNICOMPARTMENTAL
Anesthesia: Spinal | Site: Knee | Laterality: Right

## 2018-03-19 MED ORDER — VITAMIN D 25 MCG (1000 UNIT) PO TABS
1000.0000 [IU] | ORAL_TABLET | Freq: Every day | ORAL | Status: DC
  Administered 2018-03-20: 1000 [IU] via ORAL
  Filled 2018-03-19: qty 1

## 2018-03-19 MED ORDER — SODIUM CHLORIDE (PF) 0.9 % IJ SOLN
INTRAMUSCULAR | Status: AC
  Filled 2018-03-19: qty 50

## 2018-03-19 MED ORDER — PHENYLEPHRINE HCL 10 MG/ML IJ SOLN
INTRAMUSCULAR | Status: AC
  Filled 2018-03-19: qty 1

## 2018-03-19 MED ORDER — ALUM & MAG HYDROXIDE-SIMETH 200-200-20 MG/5ML PO SUSP: 30.0000 mL | ORAL | Status: DC | PRN

## 2018-03-19 MED ORDER — OXYCODONE HCL 5 MG PO TABS: 10.0000 mg | ORAL_TABLET | ORAL | Status: DC | PRN

## 2018-03-19 MED ORDER — METOCLOPRAMIDE HCL 5 MG/ML IJ SOLN: 5.0000 mg | Freq: Three times a day (TID) | INTRAMUSCULAR | Status: DC | PRN

## 2018-03-19 MED ORDER — MIDAZOLAM HCL 5 MG/5ML IJ SOLN
INTRAMUSCULAR | Status: DC | PRN
  Administered 2018-03-19 (×2): 1 mg via INTRAVENOUS

## 2018-03-19 MED ORDER — FLUOXETINE HCL 20 MG PO CAPS
20.0000 mg | ORAL_CAPSULE | Freq: Every day | ORAL | Status: DC
  Administered 2018-03-20: 20 mg via ORAL
  Filled 2018-03-19: qty 1

## 2018-03-19 MED ORDER — EPHEDRINE 5 MG/ML INJ
INTRAVENOUS | Status: AC
  Filled 2018-03-19: qty 10

## 2018-03-19 MED ORDER — METHOCARBAMOL 1000 MG/10ML IJ SOLN
500.0000 mg | Freq: Four times a day (QID) | INTRAVENOUS | Status: DC | PRN
  Filled 2018-03-19: qty 5

## 2018-03-19 MED ORDER — SODIUM CHLORIDE 0.9 % IV SOLN
INTRAVENOUS | Status: DC | PRN
  Administered 2018-03-19: 25 ug/min via INTRAVENOUS

## 2018-03-19 MED ORDER — CEFAZOLIN SODIUM-DEXTROSE 2-4 GM/100ML-% IV SOLN
2.0000 g | INTRAVENOUS | Status: AC
  Administered 2018-03-19: 2 g via INTRAVENOUS
  Filled 2018-03-19: qty 100

## 2018-03-19 MED ORDER — VITAMIN B-12 1000 MCG PO TABS
1000.0000 ug | ORAL_TABLET | Freq: Every day | ORAL | Status: DC
  Administered 2018-03-20: 1000 ug via ORAL
  Filled 2018-03-19: qty 1

## 2018-03-19 MED ORDER — OXYCODONE HCL 5 MG PO TABS
5.0000 mg | ORAL_TABLET | ORAL | Status: DC | PRN
  Administered 2018-03-19 (×2): 5 mg via ORAL
  Filled 2018-03-19 (×2): qty 1

## 2018-03-19 MED ORDER — POTASSIUM CHLORIDE IN NACL 20-0.45 MEQ/L-% IV SOLN
INTRAVENOUS | Status: DC
  Administered 2018-03-19 – 2018-03-20 (×2): via INTRAVENOUS
  Filled 2018-03-19 (×3): qty 1000

## 2018-03-19 MED ORDER — 0.9 % SODIUM CHLORIDE (POUR BTL) OPTIME
TOPICAL | Status: DC | PRN
  Administered 2018-03-19: 1000 mL

## 2018-03-19 MED ORDER — DEXAMETHASONE SODIUM PHOSPHATE 10 MG/ML IJ SOLN
INTRAMUSCULAR | Status: DC | PRN
  Administered 2018-03-19: 8 mg via INTRAVENOUS

## 2018-03-19 MED ORDER — POLYETHYLENE GLYCOL 3350 17 G PO PACK: 17.0000 g | PACK | Freq: Every day | ORAL | Status: DC | PRN

## 2018-03-19 MED ORDER — ROPIVACAINE HCL 7.5 MG/ML IJ SOLN
INTRAMUSCULAR | Status: DC | PRN
  Administered 2018-03-19: 20 mL via PERINEURAL

## 2018-03-19 MED ORDER — TRANEXAMIC ACID-NACL 1000-0.7 MG/100ML-% IV SOLN
1000.0000 mg | Freq: Once | INTRAVENOUS | Status: AC
  Administered 2018-03-19: 1000 mg via INTRAVENOUS
  Filled 2018-03-19: qty 100

## 2018-03-19 MED ORDER — CEFAZOLIN SODIUM-DEXTROSE 2-4 GM/100ML-% IV SOLN
2.0000 g | Freq: Four times a day (QID) | INTRAVENOUS | Status: AC
  Administered 2018-03-19 (×2): 2 g via INTRAVENOUS
  Filled 2018-03-19 (×2): qty 100

## 2018-03-19 MED ORDER — SODIUM CHLORIDE 0.9 % IR SOLN
Status: DC | PRN
  Administered 2018-03-19: 1000 mL

## 2018-03-19 MED ORDER — METHOCARBAMOL 500 MG PO TABS
500.0000 mg | ORAL_TABLET | Freq: Four times a day (QID) | ORAL | Status: DC | PRN
  Administered 2018-03-20: 500 mg via ORAL
  Filled 2018-03-19: qty 1

## 2018-03-19 MED ORDER — CLONAZEPAM 1 MG PO TABS
1.5000 mg | ORAL_TABLET | Freq: Every day | ORAL | Status: DC
  Administered 2018-03-19: 1.5 mg via ORAL
  Filled 2018-03-19: qty 1

## 2018-03-19 MED ORDER — ONDANSETRON HCL 4 MG/2ML IJ SOLN
INTRAMUSCULAR | Status: DC | PRN
  Administered 2018-03-19: 4 mg via INTRAVENOUS

## 2018-03-19 MED ORDER — BACLOFEN 10 MG PO TABS: 10.0000 mg | ORAL_TABLET | Freq: Three times a day (TID) | ORAL | 0 refills | Status: DC

## 2018-03-19 MED ORDER — BISACODYL 10 MG RE SUPP: 10.0000 mg | Freq: Every day | RECTAL | Status: DC | PRN

## 2018-03-19 MED ORDER — DIPHENHYDRAMINE HCL 12.5 MG/5ML PO ELIX: 12.5000 mg | ORAL_SOLUTION | ORAL | Status: DC | PRN

## 2018-03-19 MED ORDER — TRAMADOL HCL 50 MG PO TABS
50.0000 mg | ORAL_TABLET | Freq: Four times a day (QID) | ORAL | Status: DC
  Administered 2018-03-19 – 2018-03-20 (×5): 50 mg via ORAL
  Filled 2018-03-19 (×5): qty 1

## 2018-03-19 MED ORDER — DEXAMETHASONE SODIUM PHOSPHATE 10 MG/ML IJ SOLN
INTRAMUSCULAR | Status: AC
  Filled 2018-03-19: qty 1

## 2018-03-19 MED ORDER — BUPIVACAINE HCL (PF) 0.25 % IJ SOLN
INTRAMUSCULAR | Status: DC | PRN
  Administered 2018-03-19: 30 mL

## 2018-03-19 MED ORDER — HYDROMORPHONE HCL 1 MG/ML IJ SOLN: 0.5000 mg | INTRAMUSCULAR | Status: DC | PRN

## 2018-03-19 MED ORDER — MIDAZOLAM HCL 2 MG/2ML IJ SOLN
INTRAMUSCULAR | Status: AC
  Filled 2018-03-19: qty 2

## 2018-03-19 MED ORDER — CALCIUM CARBONATE 1500 (600 CA) MG PO TABS: 600.0000 mg | ORAL_TABLET | Freq: Every day | ORAL | Status: DC

## 2018-03-19 MED ORDER — CHLORHEXIDINE GLUCONATE 4 % EX LIQD: 60.0000 mL | Freq: Once | CUTANEOUS | Status: DC

## 2018-03-19 MED ORDER — PROPOFOL 500 MG/50ML IV EMUL
INTRAVENOUS | Status: DC | PRN
  Administered 2018-03-19: 75 ug/kg/min via INTRAVENOUS

## 2018-03-19 MED ORDER — MENTHOL 3 MG MT LOZG: 1.0000 | LOZENGE | OROMUCOSAL | Status: DC | PRN

## 2018-03-19 MED ORDER — PROPOFOL 10 MG/ML IV BOLUS
INTRAVENOUS | Status: AC
  Filled 2018-03-19: qty 40

## 2018-03-19 MED ORDER — PANTOPRAZOLE SODIUM 40 MG PO TBEC
40.0000 mg | DELAYED_RELEASE_TABLET | Freq: Every day | ORAL | Status: DC
  Administered 2018-03-20: 40 mg via ORAL
  Filled 2018-03-19: qty 1

## 2018-03-19 MED ORDER — ONDANSETRON HCL 4 MG PO TABS: 4.0000 mg | ORAL_TABLET | Freq: Three times a day (TID) | ORAL | 0 refills | Status: DC | PRN

## 2018-03-19 MED ORDER — ONDANSETRON HCL 4 MG/2ML IJ SOLN: 4.0000 mg | Freq: Once | INTRAMUSCULAR | Status: DC | PRN

## 2018-03-19 MED ORDER — DEXAMETHASONE SODIUM PHOSPHATE 10 MG/ML IJ SOLN
10.0000 mg | Freq: Once | INTRAMUSCULAR | Status: AC
  Administered 2018-03-20: 10 mg via INTRAVENOUS
  Filled 2018-03-19: qty 1

## 2018-03-19 MED ORDER — TRAMADOL HCL 50 MG PO TABS: 50.0000 mg | ORAL_TABLET | Freq: Every day | ORAL | 0 refills | Status: DC | PRN

## 2018-03-19 MED ORDER — PHENOL 1.4 % MT LIQD: 1.0000 | OROMUCOSAL | Status: DC | PRN

## 2018-03-19 MED ORDER — FENTANYL CITRATE (PF) 100 MCG/2ML IJ SOLN
INTRAMUSCULAR | Status: DC | PRN
  Administered 2018-03-19: 50 ug via INTRAVENOUS

## 2018-03-19 MED ORDER — PROPOFOL 10 MG/ML IV BOLUS
INTRAVENOUS | Status: AC
  Filled 2018-03-19: qty 20

## 2018-03-19 MED ORDER — IRBESARTAN 150 MG PO TABS
150.0000 mg | ORAL_TABLET | Freq: Every day | ORAL | Status: DC
  Administered 2018-03-19 – 2018-03-20 (×2): 150 mg via ORAL
  Filled 2018-03-19 (×2): qty 1

## 2018-03-19 MED ORDER — ONDANSETRON HCL 4 MG/2ML IJ SOLN
INTRAMUSCULAR | Status: AC
  Filled 2018-03-19: qty 2

## 2018-03-19 MED ORDER — EPHEDRINE SULFATE 50 MG/ML IJ SOLN
INTRAMUSCULAR | Status: DC | PRN
  Administered 2018-03-19 (×2): 5 mg via INTRAVENOUS

## 2018-03-19 MED ORDER — ACETAMINOPHEN 325 MG PO TABS: 325.0000 mg | ORAL_TABLET | Freq: Four times a day (QID) | ORAL | Status: DC | PRN

## 2018-03-19 MED ORDER — DOCUSATE SODIUM 100 MG PO CAPS
100.0000 mg | ORAL_CAPSULE | Freq: Two times a day (BID) | ORAL | Status: DC
  Administered 2018-03-19 – 2018-03-20 (×2): 100 mg via ORAL
  Filled 2018-03-19 (×2): qty 1

## 2018-03-19 MED ORDER — KETOROLAC TROMETHAMINE 30 MG/ML IJ SOLN
INTRAMUSCULAR | Status: DC | PRN
  Administered 2018-03-19: 30 mg

## 2018-03-19 MED ORDER — LACTATED RINGERS IV SOLN
INTRAVENOUS | Status: DC
  Administered 2018-03-19 (×2): via INTRAVENOUS

## 2018-03-19 MED ORDER — GABAPENTIN 300 MG PO CAPS
300.0000 mg | ORAL_CAPSULE | Freq: Three times a day (TID) | ORAL | Status: DC
  Administered 2018-03-19 – 2018-03-20 (×3): 300 mg via ORAL
  Filled 2018-03-19 (×3): qty 1

## 2018-03-19 MED ORDER — METOCLOPRAMIDE HCL 5 MG PO TABS: 5.0000 mg | ORAL_TABLET | Freq: Three times a day (TID) | ORAL | Status: DC | PRN

## 2018-03-19 MED ORDER — ACETAMINOPHEN 500 MG PO TABS
1000.0000 mg | ORAL_TABLET | Freq: Four times a day (QID) | ORAL | Status: AC
  Administered 2018-03-19 – 2018-03-20 (×4): 1000 mg via ORAL
  Filled 2018-03-19 (×4): qty 2

## 2018-03-19 MED ORDER — ASPIRIN EC 325 MG PO TBEC: 325.0000 mg | DELAYED_RELEASE_TABLET | Freq: Two times a day (BID) | ORAL | 0 refills | Status: DC

## 2018-03-19 MED ORDER — STERILE WATER FOR IRRIGATION IR SOLN
Status: DC | PRN
  Administered 2018-03-19: 1000 mL

## 2018-03-19 MED ORDER — ASPIRIN EC 325 MG PO TBEC
325.0000 mg | DELAYED_RELEASE_TABLET | Freq: Two times a day (BID) | ORAL | Status: DC
  Administered 2018-03-20: 325 mg via ORAL
  Filled 2018-03-19: qty 1

## 2018-03-19 MED ORDER — BUPIVACAINE IN DEXTROSE 0.75-8.25 % IT SOLN
INTRATHECAL | Status: DC | PRN
  Administered 2018-03-19: 1.6 mL via INTRATHECAL

## 2018-03-19 MED ORDER — OXYCODONE HCL 5 MG PO TABS: 5.0000 mg | ORAL_TABLET | ORAL | 0 refills | Status: DC | PRN

## 2018-03-19 MED ORDER — MAGNESIUM CITRATE PO SOLN: 1.0000 | Freq: Once | ORAL | Status: DC | PRN

## 2018-03-19 MED ORDER — FENTANYL CITRATE (PF) 100 MCG/2ML IJ SOLN
INTRAMUSCULAR | Status: AC
  Filled 2018-03-19: qty 2

## 2018-03-19 MED ORDER — ZOLPIDEM TARTRATE 5 MG PO TABS: 5.0000 mg | ORAL_TABLET | Freq: Every evening | ORAL | Status: DC | PRN

## 2018-03-19 MED ORDER — ONDANSETRON HCL 4 MG PO TABS: 4.0000 mg | ORAL_TABLET | Freq: Four times a day (QID) | ORAL | Status: DC | PRN

## 2018-03-19 MED ORDER — BUPIVACAINE HCL (PF) 0.25 % IJ SOLN
INTRAMUSCULAR | Status: AC
  Filled 2018-03-19: qty 30

## 2018-03-19 MED ORDER — SENNA-DOCUSATE SODIUM 8.6-50 MG PO TABS: 2.0000 | ORAL_TABLET | Freq: Every day | ORAL | 1 refills | Status: DC

## 2018-03-19 MED ORDER — CALCIUM CARBONATE 1250 (500 CA) MG PO TABS
1.0000 | ORAL_TABLET | Freq: Every day | ORAL | Status: DC
  Administered 2018-03-20: 500 mg via ORAL
  Filled 2018-03-19: qty 1

## 2018-03-19 MED ORDER — FENTANYL CITRATE (PF) 100 MCG/2ML IJ SOLN: 25.0000 ug | INTRAMUSCULAR | Status: DC | PRN

## 2018-03-19 MED ORDER — KETOROLAC TROMETHAMINE 30 MG/ML IJ SOLN
INTRAMUSCULAR | Status: AC
  Filled 2018-03-19: qty 1

## 2018-03-19 MED ORDER — ONDANSETRON HCL 4 MG/2ML IJ SOLN: 4.0000 mg | Freq: Four times a day (QID) | INTRAMUSCULAR | Status: DC | PRN

## 2018-03-19 SURGICAL SUPPLY — 66 items
BAG SPEC THK2 15X12 ZIP CLS (MISCELLANEOUS) ×1
BAG ZIPLOCK 12X15 (MISCELLANEOUS) ×3 IMPLANT
BANDAGE ESMARK 6X9 LF (GAUZE/BANDAGES/DRESSINGS) ×1 IMPLANT
BEARING MENISCAL TIBIAL 5 SM R (Orthopedic Implant) ×2 IMPLANT
BNDG CMPR 9X6 STRL LF SNTH (GAUZE/BANDAGES/DRESSINGS) ×1
BNDG CMPR MED 10X6 ELC LF (GAUZE/BANDAGES/DRESSINGS) ×1
BNDG ELASTIC 6X10 VLCR STRL LF (GAUZE/BANDAGES/DRESSINGS) ×3 IMPLANT
BNDG ESMARK 6X9 LF (GAUZE/BANDAGES/DRESSINGS) ×3
BOWL SMART MIX CTS (DISPOSABLE) ×3 IMPLANT
BRNG TIB SM 5 PHS 3 RT MEN (Orthopedic Implant) ×1 IMPLANT
CEMENT BONE R 1X40 (Cement) ×3 IMPLANT
CLOSURE WOUND 1/2 X4 (GAUZE/BANDAGES/DRESSINGS) ×1
COMPONENT TIBIAL OXFRD MEDL RT (Orthopedic Implant) IMPLANT
COVER SURGICAL LIGHT HANDLE (MISCELLANEOUS) ×3 IMPLANT
COVER WAND RF STERILE (DRAPES) ×2 IMPLANT
CUFF TOURN SGL QUICK 34 (TOURNIQUET CUFF) ×3
CUFF TRNQT CYL 34X4X40X1 (TOURNIQUET CUFF) ×1 IMPLANT
DECANTER SPIKE VIAL GLASS SM (MISCELLANEOUS) ×3 IMPLANT
DRAPE EXTREMITY T 121X128X90 (DRAPE) ×3 IMPLANT
DRAPE POUCH INSTRU U-SHP 10X18 (DRAPES) ×3 IMPLANT
DRAPE U-SHAPE 47X51 STRL (DRAPES) ×3 IMPLANT
DRSG MEPILEX BORDER 4X8 (GAUZE/BANDAGES/DRESSINGS) ×3 IMPLANT
DURAPREP 26ML APPLICATOR (WOUND CARE) ×6 IMPLANT
ELECT REM PT RETURN 15FT ADLT (MISCELLANEOUS) ×3 IMPLANT
FACESHIELD WRAPAROUND (MASK) ×3 IMPLANT
FACESHIELD WRAPAROUND OR TEAM (MASK) ×1 IMPLANT
GLOVE BIOGEL PI IND STRL 7.5 (GLOVE) IMPLANT
GLOVE BIOGEL PI IND STRL 8 (GLOVE) ×2 IMPLANT
GLOVE BIOGEL PI IND STRL 8.5 (GLOVE) ×1 IMPLANT
GLOVE BIOGEL PI INDICATOR 7.5 (GLOVE) ×8
GLOVE BIOGEL PI INDICATOR 8 (GLOVE) ×4
GLOVE BIOGEL PI INDICATOR 8.5 (GLOVE) ×2
GLOVE ORTHO TXT STRL SZ7.5 (GLOVE) ×3 IMPLANT
GLOVE SURG ORTHO 8.0 STRL STRW (GLOVE) ×3 IMPLANT
GOWN STRL REUS W/TWL 2XL LVL3 (GOWN DISPOSABLE) ×3 IMPLANT
GOWN STRL REUS W/TWL LRG LVL3 (GOWN DISPOSABLE) ×3 IMPLANT
GOWN STRL REUS W/TWL XL LVL3 (GOWN DISPOSABLE) ×2 IMPLANT
HANDPIECE INTERPULSE COAX TIP (DISPOSABLE) ×3
HOLDER FOLEY CATH W/STRAP (MISCELLANEOUS) ×2 IMPLANT
HOOD PEEL AWAY FLYTE STAYCOOL (MISCELLANEOUS) ×6 IMPLANT
IMMOBILIZER KNEE 20 (SOFTGOODS) ×3
IMMOBILIZER KNEE 20 THIGH 36 (SOFTGOODS) IMPLANT
KIT BASIN OR (CUSTOM PROCEDURE TRAY) ×3 IMPLANT
NDL SAFETY ECLIPSE 18X1.5 (NEEDLE) ×1 IMPLANT
NEEDLE HYPO 18GX1.5 SHARP (NEEDLE) ×3
PACK BLADE SAW RECIP 70 3 PT (BLADE) ×2 IMPLANT
PACK ICE MAXI GEL EZY WRAP (MISCELLANEOUS) ×3 IMPLANT
PACK TOTAL JOINT (CUSTOM PROCEDURE TRAY) ×3 IMPLANT
PAD ABD 8X10 STRL (GAUZE/BANDAGES/DRESSINGS) ×2 IMPLANT
PEG FEMORAL PEGGED STRL SM (Knees) ×2 IMPLANT
PROTECTOR NERVE ULNAR (MISCELLANEOUS) ×3 IMPLANT
SET HNDPC FAN SPRY TIP SCT (DISPOSABLE) ×1 IMPLANT
STRIP CLOSURE SKIN 1/2X4 (GAUZE/BANDAGES/DRESSINGS) ×2 IMPLANT
SUCTION FRAZIER HANDLE 12FR (TUBING) ×2
SUCTION TUBE FRAZIER 12FR DISP (TUBING) ×1 IMPLANT
SUT VIC AB 0 CT1 36 (SUTURE) ×6 IMPLANT
SUT VIC AB 2-0 CT1 27 (SUTURE) ×3
SUT VIC AB 2-0 CT1 TAPERPNT 27 (SUTURE) ×1 IMPLANT
SUT VIC AB 3-0 SH 8-18 (SUTURE) ×3 IMPLANT
SYR 20CC LL (SYRINGE) ×3 IMPLANT
SYR 3ML LL SCALE MARK (SYRINGE) ×3 IMPLANT
TIBIAL OXFORD MEDIAL RT (Orthopedic Implant) ×3 IMPLANT
TOWEL OR 17X26 10 PK STRL BLUE (TOWEL DISPOSABLE) ×3 IMPLANT
TOWEL OR NON WOVEN STRL DISP B (DISPOSABLE) ×3 IMPLANT
TRAY FOLEY MTR SLVR 14FR STAT (SET/KITS/TRAYS/PACK) ×3 IMPLANT
WRAP KNEE MAXI GEL POST OP (GAUZE/BANDAGES/DRESSINGS) ×3 IMPLANT

## 2018-03-19 NOTE — Evaluation (Signed)
Physical Therapy Evaluation Patient Details Name: Jane Brown MRN: [id number] DOB: [birth year] Today's Date: 03/19/2018   History of Present Illness  62 yo female s/p R UKR on 03/19/18. PMH includes anxiety, depression, GERD, HTN, RLS, L UKR.   Clinical Impression  Pt presents with R knee pain, decreased R knee ROM, difficulty performing mobility tasks, and decreased tolerance for ambulation due to pain and dizziness. BP 146/78 when assessed after dizziness. Pt to benefit from acute PT to address deficits. Pt ambulated 15 ft with RW with min guard assist. Pt educated on quad sets (5-10/hour), ankle pumps (20/hour), and heel slides (5-10/hour) to perform this afternoon/evening to lessen stiffness and increase circulation, to pt's tolerance and limited by pain. PT to progress mobility as tolerated, and will continue to follow acutely.      Follow Up Recommendations Follow surgeon's recommendation for DC plan and follow-up therapies;Supervision for mobility/OOB    Equipment Recommendations  None recommended by PT    Recommendations for Other Services       Precautions / Restrictions Precautions Precautions: Fall Restrictions Weight Bearing Restrictions: No Other Position/Activity Restrictions: WBAT       Mobility  Bed Mobility Overal bed mobility: Needs Assistance Bed Mobility: Supine to Sit     Supine to sit: Min assist;HOB elevated     General bed mobility comments: Min assist for RLE management. Increased time and effort   Transfers Overall transfer level: Needs assistance Equipment used: Rolling walker (2 wheeled) Transfers: Sit to/from Stand Sit to Stand: Min guard;From elevated surface         General transfer comment: Min guard for safety. Verbal cuing for hand placeement.   Ambulation/Gait Ambulation/Gait assistance: Min guard;+2 safety/equipment(chair follow ) Gait Distance (Feet): 15 Feet Assistive device: Rolling walker (2 wheeled) Gait  Pattern/deviations: Step-to pattern;Decreased stride length;Trunk flexed;Antalgic Gait velocity: decr    General Gait Details: Min guard for safety. Close R knee guarding to ensure no buckling present. Verbal cuing for sequencing. After 15 ft ambulation, with with dizziness and feeling hot. PT promptly encouraged pt to sit in chair follow. BP WNL.   Stairs            Wheelchair Mobility    Modified Rankin (Stroke Patients Only)       Balance                                             Pertinent Vitals/Pain Pain Assessment: 0-10 Pain Score: 2  Pain Location: R knee, with mobility  Pain Descriptors / Indicators: Sore Pain Intervention(s): Limited activity within patient's tolerance;Repositioned;Monitored during session;Ice applied    Home Living Family/patient expects to be discharged to:: Private residence Living Arrangements: Spouse/significant other Available Help at Discharge: Family;Available 24 hours/day Type of Home: House Home Access: Level entry     Home Layout: One level Home Equipment: Walker - 2 wheels;Shower seat;Cane - single point      Prior Function Level of Independence: Independent               Hand Dominance   Dominant Hand: Left    Extremity/Trunk Assessment   Upper Extremity Assessment Upper Extremity Assessment: Overall WFL for tasks assessed    Lower Extremity Assessment Lower Extremity Assessment: Overall WFL for tasks assessed;RLE deficits/detail RLE Deficits / Details: suspected post-surgical weakness; able to perform SLR with assist and no 5* quad lag,  quad set, ankle pumps, heel slides with assist  RLE Sensation: WNL    Cervical / Trunk Assessment Cervical / Trunk Assessment: Normal  Communication   Communication: No difficulties  Cognition Arousal/Alertness: Awake/alert Behavior During Therapy: WFL for tasks assessed/performed Overall Cognitive Status: Within Functional Limits for tasks assessed                                         General Comments      Exercises Total Joint Exercises Goniometric ROM: Knee aarom ~5-60*, limited by pain and stiffness   Assessment/Plan    PT Assessment Patient needs continued PT services  PT Problem List Decreased strength;Pain;Decreased range of motion;Decreased activity tolerance;Decreased knowledge of use of DME;Decreased balance;Decreased safety awareness;Decreased mobility       PT Treatment Interventions DME instruction;Therapeutic activities;Gait training;Therapeutic exercise;Patient/family education;Balance training;Functional mobility training    PT Goals (Current goals can be found in the Care Plan section)  Acute Rehab PT Goals Patient Stated Goal: none stated  PT Goal Formulation: With patient Time For Goal Achievement: 03/26/18 Potential to Achieve Goals: Good    Frequency 7X/week   Barriers to discharge        Co-evaluation               AM-PAC PT "6 Clicks" Daily Activity  Outcome Measure Difficulty turning over in bed (including adjusting bedclothes, sheets and blankets)?: Unable Difficulty moving from lying on back to sitting on the side of the bed? : Unable Difficulty sitting down on and standing up from a chair with arms (e.g., wheelchair, bedside commode, etc,.)?: Unable Help needed moving to and from a bed to chair (including a wheelchair)?: A Little Help needed walking in hospital room?: A Little Help needed climbing 3-5 steps with a railing? : A Little 6 Click Score: 12    End of Session Equipment Utilized During Treatment: Gait belt;Oxygen(O2 reapplied after session ) Activity Tolerance: Patient tolerated treatment well;Patient limited by fatigue;Other (comment)(dizziness) Patient left: in chair;with chair alarm set;with call bell/phone within reach;with family/visitor present;with SCD's reapplied Nurse Communication: Mobility status PT Visit Diagnosis: Difficulty in walking, not  elsewhere classified (R26.2);Other abnormalities of gait and mobility (R26.89)    Time: 1610-9604 PT Time Calculation (min) (ACUTE ONLY): 25 min   Charges:   PT Evaluation $PT Eval Low Complexity: 1 Low PT Treatments $Gait Training: 8-22 mins       Julien Girt, PT Acute Rehabilitation Services Pager [phone]  Office [phone]   Jane Brown 03/19/2018, 4:22 PM

## 2018-03-19 NOTE — Anesthesia Procedure Notes (Signed)
Spinal  Patient location during procedure: OR Start time: 03/19/2018 7:30 AM End time: 03/19/2018 7:33 AM Staffing Anesthesiologist: Catalina Gravel, MD Resident/CRNA: Glory Buff, CRNA Performed: resident/CRNA  Preanesthetic Checklist Completed: patient identified, site marked, surgical consent, pre-op evaluation, timeout performed, IV checked, risks and benefits discussed and monitors and equipment checked Spinal Block Patient position: sitting Prep: DuraPrep Patient monitoring: heart rate, continuous pulse ox and blood pressure Approach: midline Location: L3-4 Injection technique: single-shot Needle Needle type: Pencan  Needle gauge: 22 G Needle length: 9 cm Needle insertion depth: 5 cm Assessment Sensory level: T6 Additional Notes Kit date checked and verified.  Sterile prep and drape, skin local with 1% lidocaine, stick x 1, - heme, - paraesthesia, + CSF pre and post injection, patient tolerated well.

## 2018-03-19 NOTE — H&P (Signed)
PREOPERATIVE H&P  Chief Complaint: right knee pain  HPI: Jane Brown is a 62 y.o. female who presents for preoperative history and physical with a diagnosis of right knee osteoarthritis. Symptoms are rated as moderate to severe, and have been worsening.  This is significantly impairing activities of daily living.  She has elected for surgical management.   She has failed injections, activity modification, anti-inflammatories, and assistive devices.  Preoperative X-rays demonstrate end stage degenerative changes with osteophyte formation, loss of joint space, subchondral sclerosis.   Past Medical History:  Diagnosis Date  . Allergy   . Anxiety   . Depression   . Family history of adverse reaction to anesthesia    mother has post-op nausea and vomiting  . GERD (gastroesophageal reflux disease)   . Hemorrhoids   . Hypertension   . PONV (postoperative nausea and vomiting)   . Primary localized osteoarthritis of left knee 11/20/2017  . Rectal bleeding   . Restless leg syndrome   . Wears dentures    Past Surgical History:  Procedure Laterality Date  . NO PAST SURGERIES    . PARTIAL KNEE ARTHROPLASTY Left 11/20/2017   Procedure: UNICOMPARTMENTAL LEFT KNEE;  Surgeon: [name], MD;  Location: [facility];  Service: Orthopedics;  Laterality: Left;  . PARTIAL KNEE ARTHROPLASTY     Dr. Mardelle Matte 03-19-18 Right   Social History   Socioeconomic History  . Marital status: Single    Spouse name: Not on file  . Number of children: Not on file  . Years of education: Not on file  . Highest education level: Not on file  Occupational History  . Not on file  Social Needs  . Financial resource strain: Not on file  . Food insecurity:    Worry: Not on file    Inability: Not on file  . Transportation needs:    Medical: Not on file    Non-medical: Not on file  Tobacco Use  . Smoking status: Former Research scientist (life sciences)  . Smokeless tobacco: Never Used  . Tobacco comment: quit 1980s  Substance and  Sexual Activity  . Alcohol use: No  . Drug use: No  . Sexual activity: Yes  Lifestyle  . Physical activity:    Days per week: Not on file    Minutes per session: Not on file  . Stress: Not on file  Relationships  . Social connections:    Talks on phone: Not on file    Gets together: Not on file    Attends religious service: Not on file    Active member of club or organization: Not on file    Attends meetings of clubs or organizations: Not on file    Relationship status: Not on file  Other Topics Concern  . Not on file  Social History Narrative  . Not on file   Family History  Problem Relation Age of Onset  . Cancer Father        mouth cancer   Allergies  Allergen Reactions  . Hydrocodone     Headaches   . Lisinopril Cough  . Penicillins Hives, Itching and Other (See Comments)    ALL cillin drugs!!!! Has patient had a PCN reaction causing immediate rash, facial/tongue/throat swelling, SOB or lightheadedness with hypotension: Yes Has patient had a PCN reaction causing severe rash involving mucus membranes or skin necrosis: No Has patient had a PCN reaction that required hospitalization: No Has patient had a PCN reaction occurring within the last 10 years: No If all  of the above answers are "NO", then may proceed with Cephalosporin use.   . Sulfa Antibiotics     Headaches    Prior to Admission medications   Medication Sig Start Date End Date Taking? Authorizing Provider  calcium carbonate (OSCAL) 1500 (600 Ca) MG TABS tablet Take 600 mg of elemental calcium by mouth daily.   Yes [provider]  cholecalciferol (VITAMIN D) 1000 units tablet Take 1,000 Units by mouth daily.   Yes [provider]  clonazePAM (KLONOPIN) 1 MG tablet Take 1.5 mg by mouth at bedtime.    Yes [provider]  FLUoxetine (PROZAC) 20 MG capsule Take 20 mg by mouth daily.    Yes [provider]  omeprazole (PRILOSEC) 20 MG capsule Take 20 mg by mouth daily.    Yes [provider]  valsartan (DIOVAN) 160 MG tablet Take 160 mg by mouth daily.   Yes [provider]  vitamin B-12 (CYANOCOBALAMIN) 1000 MCG tablet Take 1,000 mcg by mouth daily.   Yes [provider]  aspirin EC 325 MG tablet Take 1 tablet (325 mg total) by mouth daily. Patient not taking: Reported on 03/05/2018 11/20/17   [name], MD  baclofen (LIORESAL) 10 MG tablet Take 1 tablet (10 mg total) by mouth 3 (three) times daily. As needed for muscle spasm Patient not taking: Reported on 03/05/2018 11/20/17   [name], MD  HYDROcodone-acetaminophen [facility]) 10-325 MG tablet Take 1 tablet by mouth every 6 (six) hours as needed. Patient not taking: Reported on 03/05/2018 11/20/17   [name], MD  ibuprofen (ADVIL,MOTRIN) 200 MG tablet Take 400 mg by mouth every 6 (six) hours as needed for headache or moderate pain.    [provider]  ondansetron (ZOFRAN) 4 MG tablet Take 1 tablet (4 mg total) by mouth every 8 (eight) hours as needed for nausea or vomiting. Patient not taking: Reported on 03/05/2018 11/20/17   [name], MD  sennosides-docusate sodium (SENOKOT-S) 8.6-50 MG tablet Take 2 tablets by mouth daily. Patient not taking: Reported on 03/05/2018 11/20/17   [name], MD  traMADol (ULTRAM) 50 MG tablet Take 50 mg by mouth daily as needed for pain. 12/03/17   [provider]     Positive ROS: All other systems have been reviewed and were otherwise negative with the exception of those mentioned in the HPI and as above.  Physical Exam: General: Alert, no acute distress Cardiovascular: No pedal edema Respiratory: No cyanosis, no use of accessory musculature GI: No organomegaly, abdomen is soft and non-tender Skin: No lesions in the area of chief complaint Neurologic: Sensation intact distally Psychiatric: Patient is competent for consent with normal mood and affect Lymphatic: No axillary or cervical  lymphadenopathy  MUSCULOSKELETAL: right knee with crepitance, pseudolaxity, painful arc  Assessment: Right knee anteromedial osteoarthritis   Plan: Plan for Procedure(s): UNICOMPARTMENTAL KNEE  The risks benefits and alternatives were discussed with the patient including but not limited to the risks of nonoperative treatment, versus surgical intervention including infection, bleeding, nerve injury,  blood clots, cardiopulmonary complications, morbidity, mortality, among others, and they were willing to proceed.    Patient's anticipated LOS is less than 2 midnights, meeting these requirements: - Younger than 32 - Lives within 1 hour of care - Has a competent adult at home to recover with post-op recover - NO history of  - Chronic pain requiring opiods  - Diabetes  - Coronary Artery Disease  - Heart failure  - Heart attack  -  Stroke  - DVT/VTE  - Cardiac arrhythmia  - Respiratory Failure/COPD  - Renal failure  - Anemia  - Advanced Liver disease        [name], MD Cell [phone]   03/19/2018 7:19 AM

## 2018-03-19 NOTE — Anesthesia Procedure Notes (Signed)
Date/Time: 03/19/2018 7:02 AM Performed by: Glory Buff, CRNA Oxygen Delivery Method: Nasal cannula

## 2018-03-19 NOTE — Transfer of Care (Signed)
Immediate Anesthesia Transfer of Care Note  Patient: Jane Brown  Procedure(s) Performed: UNICOMPARTMENTAL KNEE (Right Knee)  Patient Location: PACU  Anesthesia Type:MAC and Spinal  Level of Consciousness: awake, alert  and oriented  Airway & Oxygen Therapy: Patient Spontanous Breathing and Patient connected to face mask oxygen  Post-op Assessment: Report given to RN and Post -op Vital signs reviewed and stable  Post vital signs: Reviewed and stable  Last Vitals:  Vitals Value Taken Time  BP 116/74 03/19/2018  9:15 AM  Temp    Pulse 71 03/19/2018  9:15 AM  Resp 17 03/19/2018  9:15 AM  SpO2 100 % 03/19/2018  9:15 AM  Vitals shown include unvalidated device data.  Last Pain: There were no vitals filed for this visit.    Patients Stated Pain Goal: 5 (66/06/30 1601)  Complications: No apparent anesthesia complications

## 2018-03-19 NOTE — Op Note (Signed)
03/19/2018  9:07 AM  PATIENT:  Jane Brown    PRE-OPERATIVE DIAGNOSIS: Right knee primary localized osteoarthritis  POST-OPERATIVE DIAGNOSIS:  Same  PROCEDURE:  Unicompartmental Knee Arthroplasty  SURGEON:  [name], MD  PHYSICIAN ASSISTANT: Joya Gaskins, OPA-C, present and scrubbed throughout the case, critical for completion in a timely fashion, and for retraction, instrumentation, and closure.  ANESTHESIA:   Spinal  ESTIMATED BLOOD LOSS: 100 mL  UNIQUE ASPECTS OF THE CASE: There was a fairly substantial ligamentum mucosum and a sheetlike configuration which I did not release.  The ACL was intact.  There was significantly less aware than on the contralateral side, and interestingly the femur measured a size small, despite the medium fitting perfectly on the contralateral side.  The medium was definitely too big on the right side.  I had a slight amount of undercutting of the tibial spine with the whip of the cut, the tibial piece was fairly small.  I ended up with a 5, despite using the 2 mm shim.  PREOPERATIVE INDICATIONS:  Jane Brown is a  62 y.o. female with a diagnosis of djd right knee who failed conservative measures and elected for surgical management.    The risks benefits and alternatives were discussed with the patient preoperatively including but not limited to the risks of infection, bleeding, nerve injury, cardiopulmonary complications, blood clots, the need for revision surgery, among others, and the patient was willing to proceed.  OPERATIVE IMPLANTS: Biomet Oxford mobile bearing medial compartment arthroplasty femur size small, tibia size B, bearing size 5.  OPERATIVE FINDINGS: Endstage grade 4 medial compartment osteoarthritis.  Grade 2 changes on the medial facet of the patella, and the femoral trochlea, the lateral compartment had grade 1 changes centrally.  OPERATIVE PROCEDURE: The patient was brought to the operating room placed in the supine  position.  Spinal anesthesia was administered. IV antibiotics were given. The lower extremity was placed in the legholder and prepped and draped in usual sterile fashion.  Time out was performed.  The leg was elevated and exsanguinated and the tourniquet was inflated. Anteromedial incision was performed, and I took care to preserve the MCL. Parapatellar incision was carried out, and the osteophytes were excised, along with the medial meniscus and a small portion of the fat pad.  The extra medullary tibial cutting jig was applied, using the spoon and the 29mm G-Clamp and the 2 mm shim, and I took care to protect the anterior cruciate ligament insertion and the tibial spine. The medial collateral ligament was also protected, and I resected my proximal tibia, matching the anatomic slope.   The proximal tibial bony cut was removed in one piece, and I turned my attention to the femur.  The intramedullary femoral rod was placed using the drill, and then using the appropriate reference, I assembled the femoral jig, setting my posterior cutting block. I resected my posterior femur, used the 0 spigot for the anterior femur, and then measured my gap.   I then used the appropriate mill to match the extension gap to the flexion gap. The second milling was at a 4.  The gaps were then measured again with the appropriate feeler gauges. Once I had balanced flexion and extension gaps, I then completed the preparation of the femur.  I milled off the anterior aspect of the distal femur to prevent impingement. I also exposed the tibia, and selected the above-named component, and then used the cutting jig to prepare the keel slot on  the tibia. I also used the awl to curette out the bone to complete the preparation of the keel. The back wall was intact.  I then placed trial components, and it was found to have excellent motion, and appropriate balance.  I then cemented the components into place, cementing the tibia  first, removing all excess cement, and then cementing the femur.  All loose cement was removed.  The real polyethylene insert was applied manually, and the knee was taken through functional range of motion, and found to have excellent stability and restoration of joint motion, with excellent balance.  The wounds were irrigated copiously, and the parapatellar tissue closed with Vicryl, followed by Vicryl for the subcutaneous tissue, with routine closure with Steri-Strips and sterile gauze.  The tourniquet was released, and the patient was awakened and extubated and returned to PACU in stable and satisfactory condition. There were no complications.

## 2018-03-19 NOTE — Anesthesia Procedure Notes (Signed)
Date/Time: 03/19/2018 7:40 AM Performed by: Glory Buff, CRNA Oxygen Delivery Method: Simple face mask

## 2018-03-19 NOTE — Plan of Care (Signed)
Plan of care about post op day 0 discussed with patient and family. All questions answered.

## 2018-03-19 NOTE — Discharge Instructions (Signed)

## 2018-03-19 NOTE — Anesthesia Procedure Notes (Signed)
Anesthesia Regional Block: Adductor canal block   Pre-Anesthetic Checklist: ,, timeout performed, Correct Patient, Correct Site, Correct Laterality, Correct Procedure, Correct Position, site marked, Risks and benefits discussed,  Surgical consent,  Pre-op evaluation,  At surgeon's request and post-op pain management  Laterality: Right  Prep: chloraprep       Needles:  Injection technique: Single-shot  Needle Type: Echogenic Needle     Needle Length: 9cm  Needle Gauge: 21     Additional Needles:   Procedures:,,,, ultrasound used (permanent image in chart),,,,  Narrative:  Start time: 03/19/2018 7:00 AM End time: 03/19/2018 7:05 AM Injection made incrementally with aspirations every 5 mL.  Performed by: Personally  Anesthesiologist: Catalina Gravel, MD  Additional Notes: No pain on injection. No increased resistance to injection. Injection made in 5cc increments.  Good needle visualization.  Patient tolerated procedure well.

## 2018-03-19 NOTE — Anesthesia Postprocedure Evaluation (Signed)
Anesthesia Post Note  Patient: Jane Brown  Procedure(s) Performed: UNICOMPARTMENTAL KNEE (Right Knee)     Patient location during evaluation: PACU Anesthesia Type: Spinal Level of consciousness: oriented, awake and alert and awake Pain management: pain level controlled Vital Signs Assessment: post-procedure vital signs reviewed and stable Respiratory status: spontaneous breathing, respiratory function stable and nonlabored ventilation Cardiovascular status: blood pressure returned to baseline and stable Postop Assessment: no headache, no backache, no apparent nausea or vomiting, patient able to bend at knees and spinal receding Anesthetic complications: no    Last Vitals:  Vitals:   03/19/18 1237 03/19/18 1324  BP: 125/64 138/76  Pulse: 77 70  Resp: 15 16  Temp: 36.7 C 36.6 C  SpO2: 97% 97%    Last Pain:  Vitals:   03/19/18 1415  TempSrc:   PainSc: Asleep                 Catalina Gravel

## 2018-03-19 NOTE — Anesthesia Preprocedure Evaluation (Addendum)
Anesthesia Evaluation  Patient identified by MRN, date of birth, ID band Patient awake    Reviewed: Allergy & Precautions, NPO status , Patient's Chart, lab work & pertinent test results  History of Anesthesia Complications (+) PONV, Family history of anesthesia reaction and history of anesthetic complications  Airway Mallampati: II  TM Distance: >3 FB Neck ROM: Full    Dental  (+) Dental Advisory Given, Upper Dentures, Missing   Pulmonary former smoker,    Pulmonary exam normal breath sounds clear to auscultation       Cardiovascular hypertension, Pt. on medications Normal cardiovascular exam Rhythm:Regular Rate:Normal     Neuro/Psych PSYCHIATRIC DISORDERS Anxiety Depression negative neurological ROS     GI/Hepatic Neg liver ROS, GERD  Medicated,  Endo/Other  Obesity   Renal/GU negative Renal ROS     Musculoskeletal  (+) Arthritis ,   Abdominal   Peds  Hematology negative hematology ROS (+) Plt 203k   Anesthesia Other Findings Day of surgery medications reviewed with the patient.  Reproductive/Obstetrics                            Anesthesia Physical Anesthesia Plan  ASA: II  Anesthesia Plan: Spinal   Post-op Pain Management:  Regional for Post-op pain   Induction: Intravenous  PONV Risk Score and Plan: 3 and Midazolam, Propofol infusion and Treatment may vary due to age or medical condition  Airway Management Planned: Natural Airway and Simple Face Mask  Additional Equipment:   Intra-op Plan:   Post-operative Plan:   Informed Consent: I have reviewed the patients History and Physical, chart, labs and discussed the procedure including the risks, benefits and alternatives for the proposed anesthesia with the patient or authorized representative who has indicated his/her understanding and acceptance.   Dental advisory given  Plan Discussed with: CRNA, Anesthesiologist and  Surgeon  Anesthesia Plan Comments:         Anesthesia Quick Evaluation

## 2018-03-20 ENCOUNTER — Encounter (HOSPITAL_COMMUNITY): Payer: Self-pay | Admitting: Orthopedic Surgery

## 2018-03-20 DIAGNOSIS — Z885 Allergy status to narcotic agent status: Secondary | ICD-10-CM | POA: Diagnosis not present

## 2018-03-20 DIAGNOSIS — F329 Major depressive disorder, single episode, unspecified: Secondary | ICD-10-CM | POA: Diagnosis not present

## 2018-03-20 DIAGNOSIS — F419 Anxiety disorder, unspecified: Secondary | ICD-10-CM | POA: Diagnosis not present

## 2018-03-20 DIAGNOSIS — I509 Heart failure, unspecified: Secondary | ICD-10-CM | POA: Diagnosis not present

## 2018-03-20 DIAGNOSIS — G2581 Restless legs syndrome: Secondary | ICD-10-CM | POA: Diagnosis not present

## 2018-03-20 DIAGNOSIS — M1711 Unilateral primary osteoarthritis, right knee: Secondary | ICD-10-CM | POA: Diagnosis not present

## 2018-03-20 DIAGNOSIS — K219 Gastro-esophageal reflux disease without esophagitis: Secondary | ICD-10-CM | POA: Diagnosis not present

## 2018-03-20 DIAGNOSIS — I110 Hypertensive heart disease with heart failure: Secondary | ICD-10-CM | POA: Diagnosis not present

## 2018-03-20 DIAGNOSIS — Z880 Allergy status to penicillin: Secondary | ICD-10-CM | POA: Diagnosis not present

## 2018-03-20 LAB — CBC
HCT: 38.1 % (ref 36.0–46.0)
Hemoglobin: 12.2 g/dL (ref 12.0–15.0)
MCH: 28.1 pg (ref 26.0–34.0)
MCHC: 32 g/dL (ref 30.0–36.0)
MCV: 87.8 fL (ref 80.0–100.0)
PLATELETS: 192 10*3/uL (ref 150–400)
RBC: 4.34 MIL/uL (ref 3.87–5.11)
RDW: 12.9 % (ref 11.5–15.5)
WBC: 9.9 10*3/uL (ref 4.0–10.5)
nRBC: 0 % (ref 0.0–0.2)

## 2018-03-20 LAB — BASIC METABOLIC PANEL
Anion gap: 7 (ref 5–15)
BUN: 14 mg/dL (ref 8–23)
CALCIUM: 9.1 mg/dL (ref 8.9–10.3)
CO2: 25 mmol/L (ref 22–32)
CREATININE: 1.06 mg/dL — AB (ref 0.44–1.00)
Chloride: 104 mmol/L (ref 98–111)
GFR, EST NON AFRICAN AMERICAN: 55 mL/min — AB (ref >60)
Glucose, Bld: 176 mg/dL — ABNORMAL HIGH (ref 70–99)
Potassium: 4.8 mmol/L (ref 3.5–5.1)
SODIUM: 136 mmol/L (ref 135–145)

## 2018-03-20 NOTE — Progress Notes (Signed)
Patient discharged to home w/ family. Given all belongings, instructions, prescriptions. Verbalized understanding of instructions. Escorted to pov via w/c. 

## 2018-03-20 NOTE — Discharge Summary (Signed)
Physician Discharge Summary  Patient ID: Jane Brown MRN: [id number] DOB/AGE: Jul 17, 1955 62 y.o.  Admit date: 03/19/2018 Discharge date: 03/20/2018  Admission Diagnoses:  Primary localized osteoarthritis of right knee  Discharge Diagnoses:  Principal Problem:   Primary localized osteoarthritis of right knee Active Problems:   S/P knee replacement   S/P right unicompartmental knee replacement   Past Medical History:  Diagnosis Date  . Allergy   . Anxiety   . Depression   . Family history of adverse reaction to anesthesia    mother has post-op nausea and vomiting  . GERD (gastroesophageal reflux disease)   . Hemorrhoids   . Hypertension   . PONV (postoperative nausea and vomiting)   . Primary localized osteoarthritis of left knee 11/20/2017  . Primary localized osteoarthritis of right knee 03/19/2018  . Rectal bleeding   . Restless leg syndrome   . Wears dentures     Surgeries: Procedure(s): UNICOMPARTMENTAL KNEE on 03/19/2018   Consultants (if any):   Discharged Condition: Improved  Hospital Course: Jane Brown is an 62 y.o. female who was admitted 03/19/2018 with a diagnosis of Primary localized osteoarthritis of right knee and went to the operating room on 03/19/2018 and underwent the above named procedures.    She was given perioperative antibiotics:  Anti-infectives (From admission, onward)   Start     Dose/Rate Route Frequency Ordered Stop   03/19/18 1330  ceFAZolin (ANCEF) IVPB 2g/100 mL premix     2 g 200 mL/hr over 30 Minutes Intravenous Every 6 hours 03/19/18 1042 03/19/18 2048   03/19/18 0600  ceFAZolin (ANCEF) IVPB 2g/100 mL premix     2 g 200 mL/hr over 30 Minutes Intravenous On call to O.R. 03/19/18 5176 03/19/18 0804    .  She was given sequential compression devices, early ambulation, and aspirin for DVT prophylaxis.  She benefited maximally from the hospital stay and there were no complications.    Recent vital signs:  Vitals:   03/20/18 0055 03/20/18 0457  BP: 127/65 129/75  Pulse: 64 63  Resp: 16 16  Temp: 98.2 F (36.8 C) 98.3 F (36.8 C)  SpO2: 96% 98%    Recent laboratory studies:  Lab Results  Component Value Date   HGB 12.2 03/20/2018   HGB 13.6 03/12/2018   HGB 12.0 11/21/2017   Lab Results  Component Value Date   WBC 9.9 03/20/2018   PLT 192 03/20/2018   No results found for: INR Lab Results  Component Value Date   NA 136 03/20/2018   K 4.8 03/20/2018   CL 104 03/20/2018   CO2 25 03/20/2018   BUN 14 03/20/2018   CREATININE 1.06 (H) 03/20/2018   GLUCOSE 176 (H) 03/20/2018    Discharge Medications:   Allergies as of 03/20/2018      Reactions   Hydrocodone    Headaches    Lisinopril Cough   Penicillins Hives, Itching, Other (See Comments)   ALL cillin drugs!!!! Has patient had a PCN reaction causing immediate rash, facial/tongue/throat swelling, SOB or lightheadedness with hypotension: Yes Has patient had a PCN reaction causing severe rash involving mucus membranes or skin necrosis: No Has patient had a PCN reaction that required hospitalization: No Has patient had a PCN reaction occurring within the last 10 years: No If all of the above answers are "NO", then may proceed with Cephalosporin use.   Sulfa Antibiotics    Headaches      Medication List    STOP taking these medications  HYDROcodone-acetaminophen 10-325 MG tablet Commonly known as:  NORCO   ibuprofen 200 MG tablet Commonly known as:  ADVIL,MOTRIN     TAKE these medications   aspirin EC 325 MG tablet Take 1 tablet (325 mg total) by mouth 2 (two) times daily. What changed:  when to take this   baclofen 10 MG tablet Commonly known as:  LIORESAL Take 1 tablet (10 mg total) by mouth 3 (three) times daily. As needed for muscle spasm   calcium carbonate 1500 (600 Ca) MG Tabs tablet Commonly known as:  OSCAL Take 600 mg of elemental calcium by mouth daily.   cholecalciferol 1000 units tablet Commonly known  as:  VITAMIN D Take 1,000 Units by mouth daily.   clonazePAM 1 MG tablet Commonly known as:  KLONOPIN Take 1.5 mg by mouth at bedtime.   FLUoxetine 20 MG capsule Commonly known as:  PROZAC Take 20 mg by mouth daily.   omeprazole 20 MG capsule Commonly known as:  PRILOSEC Take 20 mg by mouth daily.   ondansetron 4 MG tablet Commonly known as:  ZOFRAN Take 1 tablet (4 mg total) by mouth every 8 (eight) hours as needed for nausea or vomiting.   oxyCODONE 5 MG immediate release tablet Commonly known as:  Oxy IR/ROXICODONE Take 1 tablet (5 mg total) by mouth every 4 (four) hours as needed for severe pain.   sennosides-docusate sodium 8.6-50 MG tablet Commonly known as:  SENOKOT-S Take 2 tablets by mouth daily.   traMADol 50 MG tablet Commonly known as:  ULTRAM Take 1 tablet (50 mg total) by mouth daily as needed. What changed:  reasons to take this   valsartan 160 MG tablet Commonly known as:  DIOVAN Take 160 mg by mouth daily.   vitamin B-12 1000 MCG tablet Commonly known as:  CYANOCOBALAMIN Take 1,000 mcg by mouth daily.       Diagnostic Studies: Dg Knee Right Port  Result Date: 03/19/2018 CLINICAL DATA:  Followup partial knee replacement. EXAM: PORTABLE RIGHT KNEE - 1-2 VIEW COMPARISON:  None. FINDINGS: Previous medial compartment knee arthroplasty. Components appear well positioned. Small amount of fluid and air in the joint. No radiographically detectable complication. IMPRESSION: Medial compartment knee arthroplasty. Electronically Signed   By: Nelson Chimes M.D.   On: 03/19/2018 09:39    Disposition:     Follow-up Information    [name], MD. Schedule an appointment as soon as possible for a visit in 2 weeks.   Specialty:  Orthopedic Surgery Contact information: [address] [place] [address] [phone]            Signed: [name] 03/20/2018, 7:37 AM

## 2018-03-20 NOTE — Care Management Obs Status (Signed)
[place] NOTIFICATION   Patient Details  Name: SPARKLES MCNEELY MRN: [id number] Date of Birth: [birth year]   Medicare Observation Status Notification Given:       Guadalupe Maple, RN 03/20/2018, 10:54 AM

## 2018-03-20 NOTE — Progress Notes (Signed)
Physical Therapy Treatment Patient Details Name: Jane Brown MRN: [id number] DOB: 31-Oct-1955 Today's Date: 03/20/2018    History of Present Illness 62 yo female s/p R UKR on 03/19/18. PMH includes anxiety, depression, GERD, HTN, RLS, L UKR.     PT Comments    POD # 1 am session Pt was OON in bathroom with RN assist.  Assisted out of bathroom to amb a greater distance in hallway.  No stairs to enter home.  Returned to room and Performed some TE's following HEP handout.  Instructed on proper tech, freq as well as use of ICE.   Pt ready for D/C to home.   Follow Up Recommendations  Follow surgeon's recommendation for DC plan and follow-up therapies;Supervision for mobility/OOB     Equipment Recommendations  None recommended by PT    Recommendations for Other Services       Precautions / Restrictions Precautions Precautions: Fall Restrictions Weight Bearing Restrictions: No Other Position/Activity Restrictions: WBAT     Mobility  Bed Mobility               General bed mobility comments: OOB in recliner   Transfers Overall transfer level: Needs assistance Equipment used: Rolling walker (2 wheeled) Transfers: Sit to/from Stand Sit to Stand: Min guard;From elevated surface;Supervision         General transfer comment: Min guard for safety. Verbal cuing for hand placeement.   Ambulation/Gait Ambulation/Gait assistance: Min guard;+2 safety/equipment Gait Distance (Feet): 75 Feet Assistive device: Rolling walker (2 wheeled) Gait Pattern/deviations: Step-to pattern;Decreased stride length;Trunk flexed;Antalgic Gait velocity: decreased   General Gait Details: <25% VC's on safety with turns   Marine scientist Rankin (Stroke Patients Only)       Balance                                            Cognition Arousal/Alertness: Awake/alert Behavior During Therapy: WFL for tasks  assessed/performed Overall Cognitive Status: Within Functional Limits for tasks assessed                                        Exercises      General Comments        Pertinent Vitals/Pain Pain Assessment: 0-10 Pain Score: 4  Pain Location: R knee, with mobility  Pain Descriptors / Indicators: Sore;Operative site guarding Pain Intervention(s): Monitored during session;Repositioned;Ice applied    Home Living                      Prior Function            PT Goals (current goals can now be found in the care plan section) Progress towards PT goals: Progressing toward goals    Frequency    7X/week      PT Plan Current plan remains appropriate    Co-evaluation              AM-PAC PT "6 Clicks" Daily Activity  Outcome Measure  Difficulty turning over in bed (including adjusting bedclothes, sheets and blankets)?: A Little Difficulty moving from lying on back to sitting on the side of the bed? : A Little Difficulty sitting down on and standing  up from a chair with arms (e.g., wheelchair, bedside commode, etc,.)?: A Little Help needed moving to and from a bed to chair (including a wheelchair)?: A Little Help needed walking in hospital room?: A Little Help needed climbing 3-5 steps with a railing? : A Little 6 Click Score: 18    End of Session Equipment Utilized During Treatment: Gait belt Activity Tolerance: Patient tolerated treatment well Patient left: in chair;with chair alarm set;with call bell/phone within reach Nurse Communication: Mobility status PT Visit Diagnosis: Difficulty in walking, not elsewhere classified (R26.2);Other abnormalities of gait and mobility (R26.89)     Time: 2542-7062 PT Time Calculation (min) (ACUTE ONLY): 40 min  Charges:  $Gait Training: 8-22 mins $Therapeutic Exercise: 8-22 mins $Therapeutic Activity: 8-22 mins                     Rica Koyanagi  PTA Acute  Rehabilitation Services Pager       [phone] Office      [phone]

## 2018-03-20 NOTE — Care Management Obs Status (Signed)
[facility] NOTIFICATION   Patient Details  Name: Jane Brown MRN: [id number] Date of Birth: Apr 07, 1956   Medicare Observation Status Notification Given:  Yes    Guadalupe Maple, RN 03/20/2018, 11:14 AM

## 2018-03-20 NOTE — Plan of Care (Signed)

## 2018-04-01 DIAGNOSIS — M1711 Unilateral primary osteoarthritis, right knee: Secondary | ICD-10-CM | POA: Diagnosis not present

## 2018-04-30 DIAGNOSIS — M1711 Unilateral primary osteoarthritis, right knee: Secondary | ICD-10-CM | POA: Diagnosis not present

## 2018-07-08 DIAGNOSIS — E559 Vitamin D deficiency, unspecified: Secondary | ICD-10-CM | POA: Diagnosis not present

## 2018-07-08 DIAGNOSIS — I10 Essential (primary) hypertension: Secondary | ICD-10-CM | POA: Diagnosis not present

## 2018-07-08 DIAGNOSIS — G2581 Restless legs syndrome: Secondary | ICD-10-CM | POA: Diagnosis not present

## 2018-07-08 DIAGNOSIS — F330 Major depressive disorder, recurrent, mild: Secondary | ICD-10-CM | POA: Diagnosis not present

## 2018-07-08 DIAGNOSIS — Z79899 Other long term (current) drug therapy: Secondary | ICD-10-CM | POA: Diagnosis not present

## 2018-07-08 DIAGNOSIS — K219 Gastro-esophageal reflux disease without esophagitis: Secondary | ICD-10-CM | POA: Diagnosis not present

## 2018-10-03 DIAGNOSIS — Z1231 Encounter for screening mammogram for malignant neoplasm of breast: Secondary | ICD-10-CM | POA: Diagnosis not present

## 2018-11-18 DIAGNOSIS — M170 Bilateral primary osteoarthritis of knee: Secondary | ICD-10-CM | POA: Diagnosis not present

## 2018-11-18 DIAGNOSIS — M1711 Unilateral primary osteoarthritis, right knee: Secondary | ICD-10-CM | POA: Diagnosis not present

## 2018-11-18 DIAGNOSIS — Z6835 Body mass index (BMI) 35.0-35.9, adult: Secondary | ICD-10-CM | POA: Diagnosis not present

## 2018-11-18 DIAGNOSIS — M1611 Unilateral primary osteoarthritis, right hip: Secondary | ICD-10-CM | POA: Diagnosis not present

## 2018-12-02 DIAGNOSIS — M25551 Pain in right hip: Secondary | ICD-10-CM | POA: Diagnosis not present

## 2018-12-16 DIAGNOSIS — M1611 Unilateral primary osteoarthritis, right hip: Secondary | ICD-10-CM | POA: Diagnosis not present

## 2018-12-16 DIAGNOSIS — M170 Bilateral primary osteoarthritis of knee: Secondary | ICD-10-CM | POA: Diagnosis not present

## 2018-12-16 DIAGNOSIS — Z6835 Body mass index (BMI) 35.0-35.9, adult: Secondary | ICD-10-CM | POA: Diagnosis not present

## 2018-12-16 DIAGNOSIS — M25551 Pain in right hip: Secondary | ICD-10-CM | POA: Diagnosis not present

## 2019-01-03 ENCOUNTER — Other Ambulatory Visit: Payer: Self-pay | Admitting: Obstetrics and Gynecology

## 2019-01-03 ENCOUNTER — Other Ambulatory Visit (HOSPITAL_COMMUNITY)
Admission: RE | Admit: 2019-01-03 | Discharge: 2019-01-03 | Disposition: A | Discharge status: Home / Self Care | Payer: Medicare HMO | Source: Ambulatory Visit | Attending: Obstetrics and Gynecology | Admitting: Obstetrics and Gynecology

## 2019-01-03 DIAGNOSIS — N870 Mild cervical dysplasia: Secondary | ICD-10-CM | POA: Diagnosis not present

## 2019-01-03 DIAGNOSIS — Z1151 Encounter for screening for human papillomavirus (HPV): Secondary | ICD-10-CM | POA: Diagnosis not present

## 2019-01-03 DIAGNOSIS — N8189 Other female genital prolapse: Secondary | ICD-10-CM | POA: Diagnosis not present

## 2019-01-03 DIAGNOSIS — Z01411 Encounter for gynecological examination (general) (routine) with abnormal findings: Secondary | ICD-10-CM | POA: Diagnosis not present

## 2019-01-03 DIAGNOSIS — N8110 Cystocele, unspecified: Secondary | ICD-10-CM | POA: Diagnosis not present

## 2019-01-03 DIAGNOSIS — N3281 Overactive bladder: Secondary | ICD-10-CM | POA: Diagnosis not present

## 2019-01-08 LAB — CYTOLOGY - PAP
Diagnosis: NEGATIVE
HPV: NOT DETECTED

## 2019-02-26 DIAGNOSIS — N3941 Urge incontinence: Secondary | ICD-10-CM | POA: Diagnosis not present

## 2019-02-26 DIAGNOSIS — R197 Diarrhea, unspecified: Secondary | ICD-10-CM | POA: Diagnosis not present

## 2019-02-26 DIAGNOSIS — R3915 Urgency of urination: Secondary | ICD-10-CM | POA: Diagnosis not present

## 2019-02-26 DIAGNOSIS — M6281 Muscle weakness (generalized): Secondary | ICD-10-CM | POA: Diagnosis not present

## 2019-02-26 DIAGNOSIS — M62838 Other muscle spasm: Secondary | ICD-10-CM | POA: Diagnosis not present

## 2019-02-26 DIAGNOSIS — N8110 Cystocele, unspecified: Secondary | ICD-10-CM | POA: Diagnosis not present

## 2019-03-05 DIAGNOSIS — M6281 Muscle weakness (generalized): Secondary | ICD-10-CM | POA: Diagnosis not present

## 2019-03-05 DIAGNOSIS — N8110 Cystocele, unspecified: Secondary | ICD-10-CM | POA: Diagnosis not present

## 2019-03-05 DIAGNOSIS — R159 Full incontinence of feces: Secondary | ICD-10-CM | POA: Diagnosis not present

## 2019-03-05 DIAGNOSIS — N3941 Urge incontinence: Secondary | ICD-10-CM | POA: Diagnosis not present

## 2019-03-05 DIAGNOSIS — M62838 Other muscle spasm: Secondary | ICD-10-CM | POA: Diagnosis not present

## 2019-03-05 DIAGNOSIS — R152 Fecal urgency: Secondary | ICD-10-CM | POA: Diagnosis not present

## 2019-03-12 DIAGNOSIS — R3915 Urgency of urination: Secondary | ICD-10-CM | POA: Diagnosis not present

## 2019-03-12 DIAGNOSIS — M62838 Other muscle spasm: Secondary | ICD-10-CM | POA: Diagnosis not present

## 2019-03-12 DIAGNOSIS — N3941 Urge incontinence: Secondary | ICD-10-CM | POA: Diagnosis not present

## 2019-03-12 DIAGNOSIS — R197 Diarrhea, unspecified: Secondary | ICD-10-CM | POA: Diagnosis not present

## 2019-03-12 DIAGNOSIS — R159 Full incontinence of feces: Secondary | ICD-10-CM | POA: Diagnosis not present

## 2019-03-12 DIAGNOSIS — M6281 Muscle weakness (generalized): Secondary | ICD-10-CM | POA: Diagnosis not present

## 2019-03-19 DIAGNOSIS — M6281 Muscle weakness (generalized): Secondary | ICD-10-CM | POA: Diagnosis not present

## 2019-03-19 DIAGNOSIS — M62838 Other muscle spasm: Secondary | ICD-10-CM | POA: Diagnosis not present

## 2019-03-19 DIAGNOSIS — R197 Diarrhea, unspecified: Secondary | ICD-10-CM | POA: Diagnosis not present

## 2019-03-19 DIAGNOSIS — R159 Full incontinence of feces: Secondary | ICD-10-CM | POA: Diagnosis not present

## 2019-03-19 DIAGNOSIS — R3915 Urgency of urination: Secondary | ICD-10-CM | POA: Diagnosis not present

## 2019-03-19 DIAGNOSIS — N3941 Urge incontinence: Secondary | ICD-10-CM | POA: Diagnosis not present

## 2019-03-26 DIAGNOSIS — R3915 Urgency of urination: Secondary | ICD-10-CM | POA: Diagnosis not present

## 2019-03-26 DIAGNOSIS — R159 Full incontinence of feces: Secondary | ICD-10-CM | POA: Diagnosis not present

## 2019-03-26 DIAGNOSIS — N8110 Cystocele, unspecified: Secondary | ICD-10-CM | POA: Diagnosis not present

## 2019-03-26 DIAGNOSIS — M62838 Other muscle spasm: Secondary | ICD-10-CM | POA: Diagnosis not present

## 2019-03-26 DIAGNOSIS — M6281 Muscle weakness (generalized): Secondary | ICD-10-CM | POA: Diagnosis not present

## 2019-03-26 DIAGNOSIS — N3941 Urge incontinence: Secondary | ICD-10-CM | POA: Diagnosis not present

## 2019-04-02 DIAGNOSIS — N3281 Overactive bladder: Secondary | ICD-10-CM | POA: Diagnosis not present

## 2019-04-02 DIAGNOSIS — N8110 Cystocele, unspecified: Secondary | ICD-10-CM | POA: Diagnosis not present

## 2019-04-02 DIAGNOSIS — N952 Postmenopausal atrophic vaginitis: Secondary | ICD-10-CM | POA: Diagnosis not present

## 2019-04-02 DIAGNOSIS — M6281 Muscle weakness (generalized): Secondary | ICD-10-CM | POA: Diagnosis not present

## 2019-04-02 DIAGNOSIS — N8189 Other female genital prolapse: Secondary | ICD-10-CM | POA: Diagnosis not present

## 2019-04-02 DIAGNOSIS — N3941 Urge incontinence: Secondary | ICD-10-CM | POA: Diagnosis not present

## 2019-04-02 DIAGNOSIS — M62838 Other muscle spasm: Secondary | ICD-10-CM | POA: Diagnosis not present

## 2019-04-02 DIAGNOSIS — R152 Fecal urgency: Secondary | ICD-10-CM | POA: Diagnosis not present

## 2019-04-03 DIAGNOSIS — I10 Essential (primary) hypertension: Secondary | ICD-10-CM | POA: Diagnosis not present

## 2019-04-03 DIAGNOSIS — K219 Gastro-esophageal reflux disease without esophagitis: Secondary | ICD-10-CM | POA: Diagnosis not present

## 2019-04-03 DIAGNOSIS — Z79899 Other long term (current) drug therapy: Secondary | ICD-10-CM | POA: Diagnosis not present

## 2019-04-03 DIAGNOSIS — G2581 Restless legs syndrome: Secondary | ICD-10-CM | POA: Diagnosis not present

## 2019-04-03 DIAGNOSIS — R7303 Prediabetes: Secondary | ICD-10-CM | POA: Diagnosis not present

## 2019-04-03 DIAGNOSIS — E559 Vitamin D deficiency, unspecified: Secondary | ICD-10-CM | POA: Diagnosis not present

## 2019-04-03 DIAGNOSIS — Z23 Encounter for immunization: Secondary | ICD-10-CM | POA: Diagnosis not present

## 2019-04-03 DIAGNOSIS — F330 Major depressive disorder, recurrent, mild: Secondary | ICD-10-CM | POA: Diagnosis not present

## 2019-04-03 DIAGNOSIS — Z0000 Encounter for general adult medical examination without abnormal findings: Secondary | ICD-10-CM | POA: Diagnosis not present

## 2019-04-09 DIAGNOSIS — R159 Full incontinence of feces: Secondary | ICD-10-CM | POA: Diagnosis not present

## 2019-04-09 DIAGNOSIS — R152 Fecal urgency: Secondary | ICD-10-CM | POA: Diagnosis not present

## 2019-04-09 DIAGNOSIS — N3941 Urge incontinence: Secondary | ICD-10-CM | POA: Diagnosis not present

## 2019-04-09 DIAGNOSIS — N8110 Cystocele, unspecified: Secondary | ICD-10-CM | POA: Diagnosis not present

## 2019-04-09 DIAGNOSIS — M6281 Muscle weakness (generalized): Secondary | ICD-10-CM | POA: Diagnosis not present

## 2019-04-16 DIAGNOSIS — R159 Full incontinence of feces: Secondary | ICD-10-CM | POA: Diagnosis not present

## 2019-04-16 DIAGNOSIS — M62838 Other muscle spasm: Secondary | ICD-10-CM | POA: Diagnosis not present

## 2019-04-16 DIAGNOSIS — M6281 Muscle weakness (generalized): Secondary | ICD-10-CM | POA: Diagnosis not present

## 2019-04-16 DIAGNOSIS — R3915 Urgency of urination: Secondary | ICD-10-CM | POA: Diagnosis not present

## 2019-04-16 DIAGNOSIS — N3941 Urge incontinence: Secondary | ICD-10-CM | POA: Diagnosis not present

## 2019-04-16 DIAGNOSIS — N8110 Cystocele, unspecified: Secondary | ICD-10-CM | POA: Diagnosis not present

## 2019-04-28 DIAGNOSIS — R7401 Elevation of levels of liver transaminase levels: Secondary | ICD-10-CM | POA: Diagnosis not present

## 2019-05-01 DIAGNOSIS — E785 Hyperlipidemia, unspecified: Secondary | ICD-10-CM | POA: Diagnosis not present

## 2019-05-01 DIAGNOSIS — I10 Essential (primary) hypertension: Secondary | ICD-10-CM | POA: Diagnosis not present

## 2019-05-01 DIAGNOSIS — M1712 Unilateral primary osteoarthritis, left knee: Secondary | ICD-10-CM | POA: Diagnosis not present

## 2019-05-01 DIAGNOSIS — F330 Major depressive disorder, recurrent, mild: Secondary | ICD-10-CM | POA: Diagnosis not present

## 2019-05-23 DIAGNOSIS — M1712 Unilateral primary osteoarthritis, left knee: Secondary | ICD-10-CM | POA: Diagnosis not present

## 2019-05-23 DIAGNOSIS — I10 Essential (primary) hypertension: Secondary | ICD-10-CM | POA: Diagnosis not present

## 2019-05-23 DIAGNOSIS — F330 Major depressive disorder, recurrent, mild: Secondary | ICD-10-CM | POA: Diagnosis not present

## 2019-05-23 DIAGNOSIS — E785 Hyperlipidemia, unspecified: Secondary | ICD-10-CM | POA: Diagnosis not present

## 2019-07-09 DIAGNOSIS — E785 Hyperlipidemia, unspecified: Secondary | ICD-10-CM | POA: Diagnosis not present

## 2019-07-09 DIAGNOSIS — F330 Major depressive disorder, recurrent, mild: Secondary | ICD-10-CM | POA: Diagnosis not present

## 2019-07-09 DIAGNOSIS — M1712 Unilateral primary osteoarthritis, left knee: Secondary | ICD-10-CM | POA: Diagnosis not present

## 2019-07-09 DIAGNOSIS — I10 Essential (primary) hypertension: Secondary | ICD-10-CM | POA: Diagnosis not present

## 2019-07-30 DIAGNOSIS — I10 Essential (primary) hypertension: Secondary | ICD-10-CM | POA: Diagnosis not present

## 2019-08-18 DIAGNOSIS — Z96651 Presence of right artificial knee joint: Secondary | ICD-10-CM | POA: Diagnosis not present

## 2019-08-18 DIAGNOSIS — M5431 Sciatica, right side: Secondary | ICD-10-CM | POA: Diagnosis not present

## 2019-08-29 DIAGNOSIS — I10 Essential (primary) hypertension: Secondary | ICD-10-CM | POA: Diagnosis not present

## 2019-09-15 DIAGNOSIS — M5431 Sciatica, right side: Secondary | ICD-10-CM | POA: Diagnosis not present

## 2019-10-02 DIAGNOSIS — R7401 Elevation of levels of liver transaminase levels: Secondary | ICD-10-CM | POA: Diagnosis not present

## 2019-10-02 DIAGNOSIS — E785 Hyperlipidemia, unspecified: Secondary | ICD-10-CM | POA: Diagnosis not present

## 2019-10-02 DIAGNOSIS — G2581 Restless legs syndrome: Secondary | ICD-10-CM | POA: Diagnosis not present

## 2019-10-02 DIAGNOSIS — F419 Anxiety disorder, unspecified: Secondary | ICD-10-CM | POA: Diagnosis not present

## 2019-10-02 DIAGNOSIS — F330 Major depressive disorder, recurrent, mild: Secondary | ICD-10-CM | POA: Diagnosis not present

## 2019-10-02 DIAGNOSIS — K219 Gastro-esophageal reflux disease without esophagitis: Secondary | ICD-10-CM | POA: Diagnosis not present

## 2019-10-02 DIAGNOSIS — R7303 Prediabetes: Secondary | ICD-10-CM | POA: Diagnosis not present

## 2019-10-02 DIAGNOSIS — I10 Essential (primary) hypertension: Secondary | ICD-10-CM | POA: Diagnosis not present

## 2019-10-09 DIAGNOSIS — E1169 Type 2 diabetes mellitus with other specified complication: Secondary | ICD-10-CM | POA: Diagnosis not present

## 2019-10-16 DIAGNOSIS — Z1231 Encounter for screening mammogram for malignant neoplasm of breast: Secondary | ICD-10-CM | POA: Diagnosis not present

## 2019-10-30 DIAGNOSIS — E119 Type 2 diabetes mellitus without complications: Secondary | ICD-10-CM | POA: Diagnosis not present

## 2019-10-30 DIAGNOSIS — H52203 Unspecified astigmatism, bilateral: Secondary | ICD-10-CM | POA: Diagnosis not present

## 2019-11-04 ENCOUNTER — Other Ambulatory Visit: Payer: Self-pay

## 2019-11-04 ENCOUNTER — Encounter: Payer: Medicare HMO | Attending: Family Medicine | Admitting: Dietician

## 2019-11-04 ENCOUNTER — Encounter: Payer: Self-pay | Admitting: Dietician

## 2019-11-04 DIAGNOSIS — E119 Type 2 diabetes mellitus without complications: Secondary | ICD-10-CM | POA: Insufficient documentation

## 2019-11-04 NOTE — Progress Notes (Signed)
Core Class 1  Patient was seen on 7/6/2021for the first of a series of three diabetes self-management courses at the [facility].  Patient Education Plan per assessed needs and concerns is to attend three course education program for Diabetes Self Management Education.  The following learning objectives were met by the patient during this class: ? Describe diabetes, types of diabetes and pathophysiology ? State some common risk factors for diabetes ? Defines the role of glucose and insulin ? Describe the relationship between diabetes and cardiovascular and other risks ? State the members of the Healthcare Team ? States the rationale for glucose monitoring and when to test ? State their individual Target Range ? State the importance of logging glucose readings and how to interpret the readings ? Identifies A1C target ? Explain the correlation between A1c and eAG values ? State symptoms and treatment of high blood glucose and low blood glucose ? Explain proper technique for glucose testing and identify proper sharps disposal  Handouts given during class include: ? How to Thrive: A Guide for Your Journey with Diabetes by the ADA ? Meal Plan Card and carbohydrate content list ? Dietary intake form ? Low Sodium Flavoring Tips ? Types of Fats ? Dining Out ? Label reading ? Snack list ? Planning a balanced meal ? The diabetes portion plate ? Diabetes Resources ? A1c to eAG Conversion Chart ? Blood Glucose Log ? Diabetes Recommended Care Schedule ? Support Group ? Diabetes Success Plan ? Core Class Satisfaction Survey  Follow-Up Plan: ? AttendCoreClass2 

## 2019-11-11 ENCOUNTER — Encounter: Payer: Medicare HMO | Admitting: Dietician

## 2019-11-11 ENCOUNTER — Other Ambulatory Visit: Payer: Self-pay

## 2019-11-11 DIAGNOSIS — E119 Type 2 diabetes mellitus without complications: Secondary | ICD-10-CM | POA: Diagnosis not present

## 2019-11-11 NOTE — Progress Notes (Signed)
Patient was seen on 11/11/2019 for the second of a series of three diabetes self-management courses at the [facility]. The following learning objectives were met by the patient during this class:   Describe the role of different macronutrients on glucose  Explain how carbohydrates affect blood glucose  State what foods contain the most carbohydrates  Demonstrate carbohydrate counting  Demonstrate how to read Nutrition Facts food label  Describe effects of various fats on heart health  Describe the importance of good nutrition for health and healthy eating strategies  Describe techniques for managing your shopping, cooking and meal planning  List strategies to follow meal plan when dining out  Describe the effects of alcohol on glucose and how to use it safely  Goals:  Follow Diabetes Meal Plan as instructed  Aim to spread carbs evenly throughout the day  Aim for 3 meals per day and snacks as needed Include lean protein foods to meals/snacks  Monitor glucose levels as instructed by your doctor   Follow-Up Plan:  Attend Core 3  Work towards following your personal food plan.

## 2019-11-18 ENCOUNTER — Encounter: Payer: Medicare HMO | Admitting: Dietician

## 2019-11-18 ENCOUNTER — Other Ambulatory Visit: Payer: Self-pay

## 2019-11-18 ENCOUNTER — Encounter: Payer: Self-pay | Admitting: Dietician

## 2019-11-18 DIAGNOSIS — E119 Type 2 diabetes mellitus without complications: Secondary | ICD-10-CM | POA: Diagnosis not present

## 2019-11-18 NOTE — Progress Notes (Signed)
Patient was seen on 11/18/19 for the third of a series of three diabetes self-management courses at the [facility].   Catalina Gravel the amount of activity recommended for healthy living . Describe activities suitable for individual needs . Identify ways to regularly incorporate activity into daily life . Identify barriers to activity and ways to over come these barriers  Identify diabetes medications being personally used and their primary action for lowering glucose and possible side effects . Describe role of stress on blood glucose and develop strategies to address psychosocial issues . Identify diabetes complications and ways to prevent them  Explain how to manage diabetes during illness . Evaluate success in meeting personal goal . Establish 2-3 goals that they will plan to diligently work on  Goals:   I will count my carb choices at most meals and snacks  I will eat less unhealthy fats  I will test my glucose at least 3 times a day  I will look at patterns in my record book at least 2 days a month  To help manage stress I will  read at least 4 times a week  Your patient has identified these potential barriers to change:  Finances Stress  Your patient has identified their diabetes self-care support plan as  Family Support    Plan:  Attend Support Group as desired

## 2019-12-02 DIAGNOSIS — I10 Essential (primary) hypertension: Secondary | ICD-10-CM | POA: Diagnosis not present

## 2019-12-02 DIAGNOSIS — E1169 Type 2 diabetes mellitus with other specified complication: Secondary | ICD-10-CM | POA: Diagnosis not present

## 2019-12-02 DIAGNOSIS — M1712 Unilateral primary osteoarthritis, left knee: Secondary | ICD-10-CM | POA: Diagnosis not present

## 2019-12-02 DIAGNOSIS — E785 Hyperlipidemia, unspecified: Secondary | ICD-10-CM | POA: Diagnosis not present

## 2019-12-02 DIAGNOSIS — F330 Major depressive disorder, recurrent, mild: Secondary | ICD-10-CM | POA: Diagnosis not present

## 2020-01-07 DIAGNOSIS — M1712 Unilateral primary osteoarthritis, left knee: Secondary | ICD-10-CM | POA: Diagnosis not present

## 2020-01-07 DIAGNOSIS — E1169 Type 2 diabetes mellitus with other specified complication: Secondary | ICD-10-CM | POA: Diagnosis not present

## 2020-01-07 DIAGNOSIS — F330 Major depressive disorder, recurrent, mild: Secondary | ICD-10-CM | POA: Diagnosis not present

## 2020-01-07 DIAGNOSIS — E785 Hyperlipidemia, unspecified: Secondary | ICD-10-CM | POA: Diagnosis not present

## 2020-01-07 DIAGNOSIS — I10 Essential (primary) hypertension: Secondary | ICD-10-CM | POA: Diagnosis not present

## 2020-01-08 DIAGNOSIS — Z124 Encounter for screening for malignant neoplasm of cervix: Secondary | ICD-10-CM | POA: Diagnosis not present

## 2020-01-08 DIAGNOSIS — R102 Pelvic and perineal pain: Secondary | ICD-10-CM | POA: Diagnosis not present

## 2020-01-08 DIAGNOSIS — R87610 Atypical squamous cells of undetermined significance on cytologic smear of cervix (ASC-US): Secondary | ICD-10-CM | POA: Diagnosis not present

## 2020-01-08 DIAGNOSIS — Z0000 Encounter for general adult medical examination without abnormal findings: Secondary | ICD-10-CM | POA: Diagnosis not present

## 2020-01-08 DIAGNOSIS — N8110 Cystocele, unspecified: Secondary | ICD-10-CM | POA: Diagnosis not present

## 2020-01-30 DIAGNOSIS — R87610 Atypical squamous cells of undetermined significance on cytologic smear of cervix (ASC-US): Secondary | ICD-10-CM | POA: Diagnosis not present

## 2020-01-30 DIAGNOSIS — Z87410 Personal history of cervical dysplasia: Secondary | ICD-10-CM | POA: Diagnosis not present

## 2020-02-02 DIAGNOSIS — I10 Essential (primary) hypertension: Secondary | ICD-10-CM | POA: Diagnosis not present

## 2020-02-02 DIAGNOSIS — E1169 Type 2 diabetes mellitus with other specified complication: Secondary | ICD-10-CM | POA: Diagnosis not present

## 2020-02-02 DIAGNOSIS — E559 Vitamin D deficiency, unspecified: Secondary | ICD-10-CM | POA: Diagnosis not present

## 2020-02-02 DIAGNOSIS — Z23 Encounter for immunization: Secondary | ICD-10-CM | POA: Diagnosis not present

## 2020-02-02 DIAGNOSIS — E785 Hyperlipidemia, unspecified: Secondary | ICD-10-CM | POA: Diagnosis not present

## 2020-02-02 DIAGNOSIS — F330 Major depressive disorder, recurrent, mild: Secondary | ICD-10-CM | POA: Diagnosis not present

## 2020-02-02 DIAGNOSIS — G2581 Restless legs syndrome: Secondary | ICD-10-CM | POA: Diagnosis not present

## 2020-02-16 DIAGNOSIS — N879 Dysplasia of cervix uteri, unspecified: Secondary | ICD-10-CM | POA: Diagnosis not present

## 2020-02-16 DIAGNOSIS — R87610 Atypical squamous cells of undetermined significance on cytologic smear of cervix (ASC-US): Secondary | ICD-10-CM | POA: Diagnosis not present

## 2020-02-23 DIAGNOSIS — E1169 Type 2 diabetes mellitus with other specified complication: Secondary | ICD-10-CM | POA: Diagnosis not present

## 2020-02-23 DIAGNOSIS — F330 Major depressive disorder, recurrent, mild: Secondary | ICD-10-CM | POA: Diagnosis not present

## 2020-02-23 DIAGNOSIS — E785 Hyperlipidemia, unspecified: Secondary | ICD-10-CM | POA: Diagnosis not present

## 2020-02-23 DIAGNOSIS — I10 Essential (primary) hypertension: Secondary | ICD-10-CM | POA: Diagnosis not present

## 2020-02-25 DIAGNOSIS — Q6589 Other specified congenital deformities of hip: Secondary | ICD-10-CM | POA: Diagnosis not present

## 2020-02-25 DIAGNOSIS — M5431 Sciatica, right side: Secondary | ICD-10-CM | POA: Diagnosis not present

## 2020-03-02 DIAGNOSIS — M25551 Pain in right hip: Secondary | ICD-10-CM | POA: Diagnosis not present

## 2020-03-02 DIAGNOSIS — M545 Low back pain, unspecified: Secondary | ICD-10-CM | POA: Diagnosis not present

## 2020-03-02 DIAGNOSIS — H524 Presbyopia: Secondary | ICD-10-CM | POA: Diagnosis not present

## 2020-03-02 DIAGNOSIS — H52223 Regular astigmatism, bilateral: Secondary | ICD-10-CM | POA: Diagnosis not present

## 2020-03-04 DIAGNOSIS — E1169 Type 2 diabetes mellitus with other specified complication: Secondary | ICD-10-CM | POA: Diagnosis not present

## 2020-03-04 DIAGNOSIS — F330 Major depressive disorder, recurrent, mild: Secondary | ICD-10-CM | POA: Diagnosis not present

## 2020-03-04 DIAGNOSIS — E785 Hyperlipidemia, unspecified: Secondary | ICD-10-CM | POA: Diagnosis not present

## 2020-03-04 DIAGNOSIS — I10 Essential (primary) hypertension: Secondary | ICD-10-CM | POA: Diagnosis not present

## 2020-03-04 DIAGNOSIS — K219 Gastro-esophageal reflux disease without esophagitis: Secondary | ICD-10-CM | POA: Diagnosis not present

## 2020-03-04 DIAGNOSIS — M1712 Unilateral primary osteoarthritis, left knee: Secondary | ICD-10-CM | POA: Diagnosis not present

## 2020-03-05 DIAGNOSIS — M25551 Pain in right hip: Secondary | ICD-10-CM | POA: Diagnosis not present

## 2020-03-17 DIAGNOSIS — E785 Hyperlipidemia, unspecified: Secondary | ICD-10-CM | POA: Diagnosis not present

## 2020-03-17 DIAGNOSIS — E1169 Type 2 diabetes mellitus with other specified complication: Secondary | ICD-10-CM | POA: Diagnosis not present

## 2020-03-29 DIAGNOSIS — M1611 Unilateral primary osteoarthritis, right hip: Secondary | ICD-10-CM | POA: Diagnosis not present

## 2020-03-29 DIAGNOSIS — M25551 Pain in right hip: Secondary | ICD-10-CM | POA: Diagnosis not present

## 2020-04-02 ENCOUNTER — Encounter (HOSPITAL_COMMUNITY): Payer: Self-pay

## 2020-04-02 NOTE — Patient Instructions (Signed)
DUE TO COVID-19 ONLY ONE VISITOR IS ALLOWED TO COME WITH YOU AND STAY IN THE WAITING ROOM ONLY DURING PRE OP AND PROCEDURE DAY OF SURGERY. THE 1 VISITOR  MAY VISIT WITH YOU AFTER SURGERY IN YOUR PRIVATE ROOM DURING VISITING HOURS ONLY!  YOU NEED TO HAVE A COVID 19 TEST ON__12-10-21_____ @_______ , THIS TEST MUST BE DONE BEFORE SURGERY,  COVID TESTING SITE [address] [place] [address], IT IS ON THE RIGHT GOING OUT [address] APPROXIMATELY  2 MINUTES PAST [employer] ON THE RIGHT. ONCE YOUR COVID TEST IS COMPLETED,  PLEASE BEGIN THE QUARANTINE INSTRUCTIONS AS OUTLINED IN YOUR HANDOUT.                Jane Brown  04/02/2020   Your procedure is scheduled on: 12-14 21   Report to [facility]  Entrance   Report to short stay  at        0530  AM     Call this number if you have problems the morning of surgery [phone]    Remember: NO SOLID FOOD AFTER MIDNIGHT THE NIGHT PRIOR TO SURGERY. NOTHING BY MOUTH EXCEPT CLEAR LIQUIDS UNTIL    0430 am . PLEASE FINISH G2  DRINK PER SURGEON ORDER  WHICH NEEDS TO BE COMPLETED AT     0430 am then nothing by mouth .     CLEAR LIQUID DIET   Foods Allowed                                                                      Coffee and tea, regular and decaf                              Plain Jell-O any favor except red or purple                                           Fruit ices (not with fruit pulp)                                      Iced Popsicles                                     Carbonated beverages, regular and diet                                    Cranberry, grape and apple juices Sports drinks like Gatorade Lightly seasoned clear broth or consume(fat free) Sugar, honey syrup   _____________________________________________________________________    BRUSH YOUR TEETH MORNING OF SURGERY AND RINSE YOUR MOUTH OUT, NO CHEWING GUM CANDY OR MINTS.     Take these medicines the morning of  surgery with A SIP OF WATER: prozac, omeprazole, lipitor  DO NOT TAKE ANY DIABETIC MEDICATIONS DAY OF YOUR SURGERY  You may not have any metal on your body including hair pins and              piercings  Do not wear jewelry, make-up, lotions, powders or perfumes, deodorant             Do not wear nail polish on your fingernails.  Do not shave  48 hours prior to surgery.     Do not bring valuables to the hospital. [place].  Contacts, dentures or bridgework may not be worn into surgery.  Leave suitcase in the car. After surgery it may be brought to your room.     Patients discharged the day of surgery will not be allowed to drive home. IF YOU ARE HAVING SURGERY AND GOING HOME THE SAME DAY, YOU MUST HAVE AN ADULT TO DRIVE YOU HOME AND BE WITH YOU FOR 24 HOURS. YOU MAY GO HOME BY TAXI OR UBER OR ORTHERWISE, BUT AN ADULT MUST ACCOMPANY YOU HOME AND STAY WITH YOU FOR 24 HOURS.  Name and phone number of your driver:  Special Instructions: N/A              Please read over the following fact sheets you were given: _____________________________________________________________________             [facility] - Preparing for Surgery Before surgery, you can play an important role.  Because skin is not sterile, your skin needs to be as free of germs as possible.  You can reduce the number of germs on your skin by washing with CHG (chlorahexidine gluconate) soap before surgery.  CHG is an antiseptic cleaner which kills germs and bonds with the skin to continue killing germs even after washing. Please DO NOT use if you have an allergy to CHG or antibacterial soaps.  If your skin becomes reddened/irritated stop using the CHG and inform your nurse when you arrive at Short Stay. Do not shave (including legs and underarms) for at least 48 hours prior to the first CHG shower.  You may shave your face/neck. Please follow these  instructions carefully:  1.  Shower with CHG Soap the night before surgery and the  morning of Surgery.  2.  If you choose to wash your hair, wash your hair first as usual with your  normal  shampoo.  3.  After you shampoo, rinse your hair and body thoroughly to remove the  shampoo.                           4.  Use CHG as you would any other liquid soap.  You can apply chg directly  to the skin and wash                       Gently with a scrungie or clean washcloth.  5.  Apply the CHG Soap to your body ONLY FROM THE NECK DOWN.   Do not use on face/ open                           Wound or open sores. Avoid contact with eyes, ears mouth and genitals (private parts).                       Wash face,  Genitals (private parts) with your normal soap.             6.  Wash thoroughly, paying special attention to the area where your surgery  will be performed.  7.  Thoroughly rinse your body with warm water from the neck down.  8.  DO NOT shower/wash with your normal soap after using and rinsing off  the CHG Soap.                9.  Pat yourself dry with a clean towel.            10.  Wear clean pajamas.            11.  Place clean sheets on your bed the night of your first shower and do not  sleep with pets. Day of Surgery : Do not apply any lotions/deodorants the morning of surgery.  Please wear clean clothes to the hospital/surgery center.  FAILURE TO FOLLOW THESE INSTRUCTIONS MAY RESULT IN THE CANCELLATION OF YOUR SURGERY PATIENT SIGNATURE_________________________________  NURSE SIGNATURE__________________________________  ________________________________________________________________________   Jane Brown  An incentive spirometer is a tool that can help keep your lungs clear and active. This tool measures how well you are filling your lungs with each breath. Taking long deep breaths may help reverse or decrease the chance of developing breathing (pulmonary) problems (especially  infection) following:  A long period of time when you are unable to move or be active. BEFORE THE PROCEDURE   If the spirometer includes an indicator to show your best effort, your nurse or respiratory therapist will set it to a desired goal.  If possible, sit up straight or lean slightly forward. Try not to slouch.  Hold the incentive spirometer in an upright position. INSTRUCTIONS FOR USE  1. Sit on the edge of your bed if possible, or sit up as far as you can in bed or on a chair. 2. Hold the incentive spirometer in an upright position. 3. Breathe out normally. 4. Place the mouthpiece in your mouth and seal your lips tightly around it. 5. Breathe in slowly and as deeply as possible, raising the piston or the ball toward the top of the column. 6. Hold your breath for 3-5 seconds or for as long as possible. Allow the piston or ball to fall to the bottom of the column. 7. Remove the mouthpiece from your mouth and breathe out normally. 8. Rest for a few seconds and repeat Steps 1 through 7 at least 10 times every 1-2 hours when you are awake. Take your time and take a few normal breaths between deep breaths. 9. The spirometer may include an indicator to show your best effort. Use the indicator as a goal to work toward during each repetition. 10. After each set of 10 deep breaths, practice coughing to be sure your lungs are clear. If you have an incision (the cut made at the time of surgery), support your incision when coughing by placing a pillow or rolled up towels firmly against it. Once you are able to get out of bed, walk around indoors and cough well. You may stop using the incentive spirometer when instructed by your caregiver.  RISKS AND COMPLICATIONS  Take your time so you do not get dizzy or light-headed.  If you are in pain, you may need to take or ask for pain medication before doing incentive spirometry. It is harder to take a deep breath if you are having pain. AFTER  USE  Rest and breathe slowly and easily.  It can be helpful to keep track of a log of your progress. Your caregiver can provide you with a simple table to help with this. If you are using the spirometer at home, follow these instructions: [place] IF:   You are having difficultly using the spirometer.  You have trouble using the spirometer as often as instructed.  Your pain medication is not giving enough relief while using the spirometer.  You develop fever of 100.5 F (38.1 C) or higher. SEEK IMMEDIATE MEDICAL CARE IF:   You cough up bloody sputum that had not been present before.  You develop fever of 102 F (38.9 C) or greater.  You develop worsening pain at or near the incision site. MAKE SURE YOU:   Understand these instructions.  Will watch your condition.  Will get help right away if you are not doing well or get worse. Document Released: 08/28/2006 Document Revised: 07/10/2011 Document Reviewed: 10/29/2006 [facility] Patient Information 2014 [place], [place].   ________________________________________________________________________

## 2020-04-02 NOTE — Progress Notes (Addendum)
PCP - Sheliah Hatch Cardiologist - no  PPM/ICD -  Device Orders -  Rep Notified -   Chest x-ray -  EKG - 04/05/20 Stress Test -  ECHO -  Cardiac Cath -   Sleep Study -  CPAP -   Fasting Blood Sugar -90-100'S  Checks Blood Sugar _2x a day ____ times a day  Blood Thinner Instructions: Aspirin Instructions:  ERAS Protcol - PRE-SURGERY Ensure or G2-   COVID TEST- 12-10  Activity--able to walk a flight of stairs without sob, does own housework  Anesthesia review: HTN,DM  Patient denies shortness of breath, fever, cough and chest pain at PAT appointment  NONE   All instructions explained to the patient, with a verbal understanding of the material. Patient agrees to go over the instructions while at home for a better understanding. Patient also instructed to self quarantine after being tested for COVID-19. The opportunity to ask questions was provided.

## 2020-04-05 ENCOUNTER — Encounter (HOSPITAL_COMMUNITY)
Admission: RE | Admit: 2020-04-05 | Discharge: 2020-04-05 | Disposition: A | Discharge status: Home / Self Care | Payer: Medicare HMO | Source: Ambulatory Visit | Attending: Orthopedic Surgery | Admitting: Orthopedic Surgery

## 2020-04-05 ENCOUNTER — Other Ambulatory Visit: Payer: Self-pay

## 2020-04-05 ENCOUNTER — Encounter (HOSPITAL_COMMUNITY): Payer: Self-pay

## 2020-04-05 DIAGNOSIS — Z01818 Encounter for other preprocedural examination: Secondary | ICD-10-CM | POA: Insufficient documentation

## 2020-04-05 HISTORY — DX: Type 2 diabetes mellitus without complications: E11.9

## 2020-04-05 LAB — CBC
HCT: 40.4 % (ref 36.0–46.0)
Hemoglobin: 13 g/dL (ref 12.0–15.0)
MCH: 28.1 pg (ref 26.0–34.0)
MCHC: 32.2 g/dL (ref 30.0–36.0)
MCV: 87.4 fL (ref 80.0–100.0)
Platelets: 172 10*3/uL (ref 150–400)
RBC: 4.62 MIL/uL (ref 3.87–5.11)
RDW: 15.3 % (ref 11.5–15.5)
WBC: 6.5 10*3/uL (ref 4.0–10.5)
nRBC: 0 % (ref 0.0–0.2)

## 2020-04-05 LAB — GLUCOSE, CAPILLARY: Glucose-Capillary: 132 mg/dL — ABNORMAL HIGH (ref 70–99)

## 2020-04-05 LAB — BASIC METABOLIC PANEL
Anion gap: 10 (ref 5–15)
BUN: 17 mg/dL (ref 8–23)
CO2: 25 mmol/L (ref 22–32)
Calcium: 9.4 mg/dL (ref 8.9–10.3)
Chloride: 103 mmol/L (ref 98–111)
Creatinine, Ser: 0.95 mg/dL (ref 0.44–1.00)
GFR, Estimated: >60 mL/min (ref >60)
Glucose, Bld: 111 mg/dL — ABNORMAL HIGH (ref 70–99)
Potassium: 3.7 mmol/L (ref 3.5–5.1)
Sodium: 138 mmol/L (ref 135–145)

## 2020-04-05 LAB — HEMOGLOBIN A1C
Hgb A1c MFr Bld: 6 % — ABNORMAL HIGH (ref 4.8–5.6)
Mean Plasma Glucose: 125.5 mg/dL

## 2020-04-05 LAB — SURGICAL PCR SCREEN
MRSA, PCR: NEGATIVE
Staphylococcus aureus: NEGATIVE

## 2020-04-05 NOTE — Care Plan (Signed)
Ortho Bundle Case Management Note  Patient Details  Name: Jane Brown MRN: [id number] Date of Birth: 1955-10-15   Spoke with patient prior to surgery in the office. She will discharge to home with family to assist. She has all needed equipment at home. OPPT set up at [facility].  Patient and MD in agreement with plan. CHoice offered                   DME Arranged:    DME Agency:     HH Arranged:    HH Agency:     Additional Comments: Please contact me with any questions of if this plan should need to change.  Ladell Heads,  [place] [facility] Specialist  [phone] 04/05/2020, 2:41 PM

## 2020-04-09 ENCOUNTER — Other Ambulatory Visit (HOSPITAL_COMMUNITY)
Admission: RE | Admit: 2020-04-09 | Discharge: 2020-04-09 | Disposition: A | Discharge status: Home / Self Care | Payer: Medicare HMO | Source: Ambulatory Visit | Attending: Orthopedic Surgery | Admitting: Orthopedic Surgery

## 2020-04-09 DIAGNOSIS — Z20822 Contact with and (suspected) exposure to covid-19: Secondary | ICD-10-CM | POA: Insufficient documentation

## 2020-04-09 DIAGNOSIS — Z01812 Encounter for preprocedural laboratory examination: Secondary | ICD-10-CM | POA: Diagnosis not present

## 2020-04-09 LAB — SARS CORONAVIRUS 2 (TAT 6-24 HRS): SARS Coronavirus 2: NEGATIVE

## 2020-04-12 NOTE — H&P (Signed)
HIP ARTHROPLASTY ADMISSION H&P  Patient ID: Jane Brown MRN: [id number] DOB/AGE: 1955-11-06 64 y.o.  Chief Complaint: right hip pain.  Planned Procedure Date: 04/13/20 Medical Clearance by Harlan Stains, MD   HPI: Jane Brown is a 64 y.o. female who presents for evaluation of djd right hip. The patient has a history of pain and functional disability in the right hip due to arthritis and has failed non-surgical conservative treatments for greater than 12 weeks to include NSAID's and/or analgesics, corticosteriod injections and activity modification.  Onset of symptoms was gradual, starting 3 years ago with gradually worsening course since that time. The patient noted no past surgery on the right hip.  Patient currently rates pain at 8 out of 10 with activity. Patient has worsening of pain with activity and weight bearing, pain that interferes with activities of daily living and pain with passive range of motion.  Patient has evidence of joint space narrowing by imaging studies.  There is no active infection.  Past Medical History:  Diagnosis Date  . Allergy   . Anxiety   . Depression   . Diabetes mellitus without complication ([place])    type 2  . Family history of adverse reaction to anesthesia    mother has post-op nausea and vomiting  . GERD (gastroesophageal reflux disease)   . Hemorrhoids   . Hypertension   . Primary localized osteoarthritis of left knee 11/20/2017  . Primary localized osteoarthritis of right knee 03/19/2018  . Rectal bleeding   . Restless leg syndrome   . Wears dentures    Past Surgical History:  Procedure Laterality Date  . NO PAST SURGERIES    . PARTIAL KNEE ARTHROPLASTY Left 11/20/2017   Procedure: UNICOMPARTMENTAL LEFT KNEE;  Surgeon: [name], MD;  Location: [facility];  Service: Orthopedics;  Laterality: Left;  . PARTIAL KNEE ARTHROPLASTY     Dr. Mardelle Matte 03-19-18 Right  . PARTIAL KNEE ARTHROPLASTY Right 03/19/2018   Procedure: UNICOMPARTMENTAL  KNEE;  Surgeon: [name], MD;  Location: [facility];  Service: Orthopedics;  Laterality: Right;  Adductor Block   Allergies  Allergen Reactions  . Hydrocodone     Headaches   . Lisinopril Cough  . Penicillins Hives, Itching and Other (See Comments)    ALL cillin drugs!!!! Has patient had a PCN reaction causing immediate rash, facial/tongue/throat swelling, SOB or lightheadedness with hypotension: Yes Has patient had a PCN reaction causing severe rash involving mucus membranes or skin necrosis: No Has patient had a PCN reaction that required hospitalization: No Has patient had a PCN reaction occurring within the last 10 years: No If all of the above answers are "NO", then may proceed with Cephalosporin use.   . Sulfa Antibiotics     Headaches    Prior to Admission medications   Medication Sig Start Date End Date Taking? Authorizing Provider  baclofen (LIORESAL) 10 MG tablet Take 1 tablet (10 mg total) by mouth 3 (three) times daily. As needed for muscle spasm 03/19/18  Yes [name], MD  calcium carbonate (OSCAL) 1500 (600 Ca) MG TABS tablet Take 600 mg of elemental calcium by mouth daily.   Yes [provider]  cholecalciferol (VITAMIN D) 1000 units tablet Take 1,000 Units by mouth daily.   Yes [provider]  clonazePAM (KLONOPIN) 1 MG tablet Take 1.5 mg by mouth at bedtime.    Yes [provider]  FLUoxetine (PROZAC) 40 MG capsule Take 40 mg by mouth daily.    Yes [provider]  metFORMIN (GLUCOPHAGE-XR) 500 MG 24 hr tablet Take 500 mg by mouth every evening.   Yes [provider]  omeprazole (PRILOSEC) 20 MG capsule Take 20 mg by mouth daily.   Yes [provider]  valsartan (DIOVAN) 160 MG tablet Take 160 mg by mouth daily.   Yes [provider]  vitamin B-12 (CYANOCOBALAMIN) 1000 MCG tablet Take 1,000 mcg by mouth daily.   Yes [provider]  atorvastatin (LIPITOR) 20 MG tablet Take 20 mg by mouth  daily.  02/03/20   [provider]   Social History   Socioeconomic History  . Marital status: Single    Spouse name: Not on file  . Number of children: Not on file  . Years of education: Not on file  . Highest education level: Not on file  Occupational History  . Not on file  Tobacco Use  . Smoking status: Former Research scientist (life sciences)  . Smokeless tobacco: Never Used  . Tobacco comment: quit 1980s  Vaping Use  . Vaping Use: Never used  Substance and Sexual Activity  . Alcohol use: No  . Drug use: No  . Sexual activity: Yes    Birth control/protection: None  Other Topics Concern  . Not on file  Social History Narrative  . Not on file   Social Determinants of Health   Financial Resource Strain: Not on file  Food Insecurity: Not on file  Transportation Needs: Not on file  Physical Activity: Not on file  Stress: Not on file  Social Connections: Not on file   Family History  Problem Relation Age of Onset  . Cancer Father        mouth cancer    ROS: Currently denies lightheadedness, dizziness, Fever, chills, CP, SOB.   No personal history of DVT, PE, MI, or CVA. Patient dose have dentures which she will remove prior to surgery All other systems have been reviewed and were otherwise currently negative with the exception of those mentioned in the HPI and as above.  Objective: Vitals: HT: 5 feet, 7 inches; WT: 186.8; BMI: 29.2; TEMP: 6F; BP: 125/77; PULSE: 69; O2: 96% on room air. Physical Exam: General: Alert, NAD. HEENT: EOMI, Good Neck Extension  Pulm: No increased work of breathing.  Clear B/L A/P w/o crackle or wheeze.  CV: RRR, No m/g/r appreciated  GI: soft, NT, ND Neuro: Neuro without gross focal deficit.  Sensation intact distally Skin: No lesions in the area of chief complaint MSK/Surgical Site: walks with a cane at baseline. She has 0-10 degrees of range of motion of the right hip with really minimal ability to internally and externally rotate. Severe pain with  passive internal and external rotation. EHL and FHL are intact.    Imaging Review Plain radiographs demonstrate severe degenerative joint disease of the right hip.   Assessment: Principal Problem:   Osteoarthritis of right hip   Plan: Plan for Procedure(s): TOTAL HIP ARTHROPLASTY  The patient history, physical exam, clinical judgement of the provider and imaging are consistent with end stage degenerative joint disease and total joint arthroplasty is deemed medically necessary. The treatment options including medical management, injection therapy, and arthroplasty were discussed at length. The risks and benefits of Procedure(s): TOTAL HIP ARTHROPLASTY were presented and reviewed.  The risks of nonoperative treatment, versus surgical intervention including but not limited to continued pain, aseptic loosening, stiffness, dislocation/subluxation, infection, bleeding, nerve injury, blood clots, cardiopulmonary complications, morbidity, mortality, among others were discussed. The patient verbalizes understanding and wishes  to proceed with the plan.   Dental prophylaxis discussed and recommended for 2 years postoperatively.   The patient does meet the criteria for TXA which will be used perioperatively.    ASA 325 mg will be used postoperatively for DVT prophylaxis in addition to SCDs, and early ambulation.   Patient's anticipated LOS is less than 2 midnights, meeting these requirements: - Younger than 54 - Lives within 1 hour of care - Has a competent adult at home to recover with post-op recover - NO history of  - Chronic pain requiring opiods  - Diabetes  - Coronary Artery Disease  - Heart failure  - Heart attack  - Stroke  - DVT/VTE  - Cardiac arrhythmia  - Respiratory Failure/COPD  - Renal failure  - Anemia  - Advanced Liver disease       Ventura Bruns, PA-C 04/12/2020 12:28 PM

## 2020-04-13 ENCOUNTER — Observation Stay (HOSPITAL_COMMUNITY): Payer: Medicare HMO

## 2020-04-13 ENCOUNTER — Encounter (HOSPITAL_COMMUNITY): Payer: Self-pay | Admitting: Orthopedic Surgery

## 2020-04-13 ENCOUNTER — Observation Stay (HOSPITAL_COMMUNITY)
Admission: RE | Admit: 2020-04-13 | Discharge: 2020-04-14 | Disposition: A | Discharge status: Home / Self Care | Payer: Medicare HMO | Attending: Orthopedic Surgery | Admitting: Orthopedic Surgery

## 2020-04-13 ENCOUNTER — Other Ambulatory Visit: Payer: Self-pay

## 2020-04-13 ENCOUNTER — Ambulatory Visit (HOSPITAL_COMMUNITY): Payer: Medicare HMO | Admitting: Anesthesiology

## 2020-04-13 ENCOUNTER — Encounter (HOSPITAL_COMMUNITY)
Admission: RE | Disposition: A | Discharge status: Home / Self Care | Payer: Self-pay | Source: Home / Self Care | Attending: Orthopedic Surgery

## 2020-04-13 DIAGNOSIS — Z87891 Personal history of nicotine dependence: Secondary | ICD-10-CM | POA: Insufficient documentation

## 2020-04-13 DIAGNOSIS — E119 Type 2 diabetes mellitus without complications: Secondary | ICD-10-CM | POA: Diagnosis not present

## 2020-04-13 DIAGNOSIS — M1611 Unilateral primary osteoarthritis, right hip: Principal | ICD-10-CM | POA: Insufficient documentation

## 2020-04-13 DIAGNOSIS — Z7984 Long term (current) use of oral hypoglycemic drugs: Secondary | ICD-10-CM | POA: Insufficient documentation

## 2020-04-13 DIAGNOSIS — Z79899 Other long term (current) drug therapy: Secondary | ICD-10-CM | POA: Diagnosis not present

## 2020-04-13 DIAGNOSIS — Z7952 Long term (current) use of systemic steroids: Secondary | ICD-10-CM | POA: Diagnosis not present

## 2020-04-13 DIAGNOSIS — I10 Essential (primary) hypertension: Secondary | ICD-10-CM | POA: Insufficient documentation

## 2020-04-13 DIAGNOSIS — Z471 Aftercare following joint replacement surgery: Secondary | ICD-10-CM | POA: Diagnosis not present

## 2020-04-13 DIAGNOSIS — Z96653 Presence of artificial knee joint, bilateral: Secondary | ICD-10-CM | POA: Diagnosis not present

## 2020-04-13 DIAGNOSIS — M25551 Pain in right hip: Secondary | ICD-10-CM | POA: Diagnosis present

## 2020-04-13 DIAGNOSIS — Z791 Long term (current) use of non-steroidal anti-inflammatories (NSAID): Secondary | ICD-10-CM | POA: Diagnosis not present

## 2020-04-13 DIAGNOSIS — Z96641 Presence of right artificial hip joint: Secondary | ICD-10-CM

## 2020-04-13 HISTORY — PX: TOTAL HIP ARTHROPLASTY: SHX124

## 2020-04-13 LAB — TYPE AND SCREEN
ABO/RH(D): A POS
Antibody Screen: NEGATIVE

## 2020-04-13 LAB — GLUCOSE, CAPILLARY
Glucose-Capillary: 97 mg/dL (ref 70–99)
Glucose-Capillary: 112 mg/dL — ABNORMAL HIGH (ref 70–99)

## 2020-04-13 LAB — ABO/RH: ABO/RH(D): A POS

## 2020-04-13 SURGERY — ARTHROPLASTY, HIP, TOTAL,POSTERIOR APPROACH
Anesthesia: Monitor Anesthesia Care | Site: Hip | Laterality: Right

## 2020-04-13 MED ORDER — ONDANSETRON HCL 4 MG PO TABS: 4.0000 mg | ORAL_TABLET | Freq: Three times a day (TID) | ORAL | 0 refills | Status: DC | PRN

## 2020-04-13 MED ORDER — ALUM & MAG HYDROXIDE-SIMETH 200-200-20 MG/5ML PO SUSP: 30.0000 mL | ORAL | Status: DC | PRN

## 2020-04-13 MED ORDER — ACETAMINOPHEN 325 MG PO TABS: 325.0000 mg | ORAL_TABLET | Freq: Four times a day (QID) | ORAL | Status: DC | PRN

## 2020-04-13 MED ORDER — PROPOFOL 1000 MG/100ML IV EMUL
INTRAVENOUS | Status: AC
  Filled 2020-04-13: qty 100

## 2020-04-13 MED ORDER — CEFAZOLIN SODIUM-DEXTROSE 2-4 GM/100ML-% IV SOLN
2.0000 g | Freq: Four times a day (QID) | INTRAVENOUS | Status: AC
  Administered 2020-04-13 (×2): 2 g via INTRAVENOUS
  Filled 2020-04-13 (×2): qty 100

## 2020-04-13 MED ORDER — STERILE WATER FOR IRRIGATION IR SOLN
Status: DC | PRN
  Administered 2020-04-13: 2000 mL

## 2020-04-13 MED ORDER — METOCLOPRAMIDE HCL 5 MG PO TABS
5.0000 mg | ORAL_TABLET | Freq: Three times a day (TID) | ORAL | Status: DC | PRN
Start: 2020-04-13 — End: 2020-04-14

## 2020-04-13 MED ORDER — KETOROLAC TROMETHAMINE 30 MG/ML IJ SOLN
INTRAMUSCULAR | Status: AC
  Filled 2020-04-13: qty 1

## 2020-04-13 MED ORDER — CALCIUM CARBONATE 1250 (500 CA) MG PO TABS
500.0000 mg | ORAL_TABLET | Freq: Every day | ORAL | Status: DC
  Administered 2020-04-14: 10:00:00 500 mg via ORAL
  Filled 2020-04-13: qty 1

## 2020-04-13 MED ORDER — ACETAMINOPHEN 500 MG PO TABS
1000.0000 mg | ORAL_TABLET | Freq: Four times a day (QID) | ORAL | Status: AC
  Administered 2020-04-13 – 2020-04-14 (×4): 1000 mg via ORAL
  Filled 2020-04-13 (×4): qty 2

## 2020-04-13 MED ORDER — METHOCARBAMOL 500 MG PO TABS: 500.0000 mg | ORAL_TABLET | Freq: Four times a day (QID) | ORAL | Status: DC | PRN

## 2020-04-13 MED ORDER — PROPOFOL 500 MG/50ML IV EMUL
INTRAVENOUS | Status: DC | PRN
  Administered 2020-04-13: 75 ug/kg/min via INTRAVENOUS

## 2020-04-13 MED ORDER — VITAMIN D 25 MCG (1000 UNIT) PO TABS
1000.0000 [IU] | ORAL_TABLET | Freq: Every day | ORAL | Status: DC
  Administered 2020-04-14: 10:00:00 1000 [IU] via ORAL
  Filled 2020-04-13: qty 1

## 2020-04-13 MED ORDER — HYDROMORPHONE HCL 1 MG/ML IJ SOLN: 0.5000 mg | INTRAMUSCULAR | Status: DC | PRN

## 2020-04-13 MED ORDER — DEXAMETHASONE SODIUM PHOSPHATE 10 MG/ML IJ SOLN
10.0000 mg | Freq: Once | INTRAMUSCULAR | Status: AC
  Administered 2020-04-14: 10:00:00 10 mg via INTRAVENOUS
  Filled 2020-04-13: qty 1

## 2020-04-13 MED ORDER — ASPIRIN EC 325 MG PO TBEC
325.0000 mg | DELAYED_RELEASE_TABLET | Freq: Two times a day (BID) | ORAL | Status: DC
  Administered 2020-04-13 – 2020-04-14 (×2): 325 mg via ORAL
  Filled 2020-04-13 (×2): qty 1

## 2020-04-13 MED ORDER — CLONAZEPAM 1 MG PO TABS
1.5000 mg | ORAL_TABLET | Freq: Every day | ORAL | Status: DC
  Administered 2020-04-13: 21:00:00 1.5 mg via ORAL
  Filled 2020-04-13: qty 1

## 2020-04-13 MED ORDER — POVIDONE-IODINE 10 % EX SWAB
2.0000 "application " | Freq: Once | CUTANEOUS | Status: AC
  Administered 2020-04-13: 2 via TOPICAL

## 2020-04-13 MED ORDER — BISACODYL 10 MG RE SUPP: 10.0000 mg | Freq: Every day | RECTAL | Status: DC | PRN

## 2020-04-13 MED ORDER — BACLOFEN 10 MG PO TABS: 10.0000 mg | ORAL_TABLET | Freq: Three times a day (TID) | ORAL | 0 refills | Status: DC

## 2020-04-13 MED ORDER — KETOROLAC TROMETHAMINE 30 MG/ML IJ SOLN
INTRAMUSCULAR | Status: DC | PRN
  Administered 2020-04-13: 30 mg via INTRAVENOUS

## 2020-04-13 MED ORDER — 0.9 % SODIUM CHLORIDE (POUR BTL) OPTIME
TOPICAL | Status: DC | PRN
  Administered 2020-04-13: 08:00:00 1000 mL

## 2020-04-13 MED ORDER — ONDANSETRON HCL 4 MG PO TABS: 4.0000 mg | ORAL_TABLET | Freq: Four times a day (QID) | ORAL | Status: DC | PRN

## 2020-04-13 MED ORDER — PHENYLEPHRINE 40 MCG/ML (10ML) SYRINGE FOR IV PUSH (FOR BLOOD PRESSURE SUPPORT)
PREFILLED_SYRINGE | INTRAVENOUS | Status: DC | PRN
  Administered 2020-04-13 (×8): 80 ug via INTRAVENOUS

## 2020-04-13 MED ORDER — ORAL CARE MOUTH RINSE: 15.0000 mL | Freq: Once | OROMUCOSAL | Status: AC

## 2020-04-13 MED ORDER — PANTOPRAZOLE SODIUM 40 MG PO TBEC
40.0000 mg | DELAYED_RELEASE_TABLET | Freq: Every day | ORAL | Status: DC
  Administered 2020-04-13 – 2020-04-14 (×2): 40 mg via ORAL
  Filled 2020-04-13 (×2): qty 1

## 2020-04-13 MED ORDER — ALBUMIN HUMAN 5 % IV SOLN
INTRAVENOUS | Status: AC
  Filled 2020-04-13: qty 250

## 2020-04-13 MED ORDER — VITAMIN B-12 1000 MCG PO TABS
1000.0000 ug | ORAL_TABLET | Freq: Every day | ORAL | Status: DC
  Administered 2020-04-13 – 2020-04-14 (×2): 1000 ug via ORAL
  Filled 2020-04-13 (×2): qty 1

## 2020-04-13 MED ORDER — LACTATED RINGERS IV SOLN: INTRAVENOUS | Status: DC

## 2020-04-13 MED ORDER — BUPIVACAINE HCL (PF) 0.5 % IJ SOLN
INTRAMUSCULAR | Status: DC | PRN
  Administered 2020-04-13: 30 mL

## 2020-04-13 MED ORDER — METFORMIN HCL ER 500 MG PO TB24: 500.0000 mg | ORAL_TABLET | Freq: Every evening | ORAL | Status: DC

## 2020-04-13 MED ORDER — SENNA-DOCUSATE SODIUM 8.6-50 MG PO TABS: 2.0000 | ORAL_TABLET | Freq: Every day | ORAL | 1 refills | Status: DC

## 2020-04-13 MED ORDER — PHENYLEPHRINE 40 MCG/ML (10ML) SYRINGE FOR IV PUSH (FOR BLOOD PRESSURE SUPPORT)
PREFILLED_SYRINGE | INTRAVENOUS | Status: AC
  Filled 2020-04-13: qty 10

## 2020-04-13 MED ORDER — ONDANSETRON HCL 4 MG/2ML IJ SOLN
INTRAMUSCULAR | Status: DC | PRN
  Administered 2020-04-13: 4 mg via INTRAVENOUS

## 2020-04-13 MED ORDER — ATORVASTATIN CALCIUM 20 MG PO TABS
20.0000 mg | ORAL_TABLET | Freq: Every day | ORAL | Status: DC
Start: 2020-04-13 — End: 2020-04-14
  Administered 2020-04-13 – 2020-04-14 (×2): 20 mg via ORAL
  Filled 2020-04-13 (×2): qty 1

## 2020-04-13 MED ORDER — PROMETHAZINE HCL 25 MG/ML IJ SOLN
6.2500 mg | INTRAMUSCULAR | Status: DC | PRN
Start: 2020-04-13 — End: 2020-04-13

## 2020-04-13 MED ORDER — IRBESARTAN 150 MG PO TABS
150.0000 mg | ORAL_TABLET | Freq: Every day | ORAL | Status: DC
  Administered 2020-04-14: 10:00:00 150 mg via ORAL
  Filled 2020-04-13: qty 1

## 2020-04-13 MED ORDER — ALBUMIN HUMAN 5 % IV SOLN: INTRAVENOUS | Status: DC | PRN

## 2020-04-13 MED ORDER — PROPOFOL 10 MG/ML IV BOLUS
INTRAVENOUS | Status: AC
  Filled 2020-04-13: qty 20

## 2020-04-13 MED ORDER — CHLORHEXIDINE GLUCONATE 0.12 % MT SOLN
15.0000 mL | Freq: Once | OROMUCOSAL | Status: AC
  Administered 2020-04-13: 06:00:00 15 mL via OROMUCOSAL

## 2020-04-13 MED ORDER — KETOROLAC TROMETHAMINE 15 MG/ML IJ SOLN
15.0000 mg | Freq: Four times a day (QID) | INTRAMUSCULAR | Status: AC
  Administered 2020-04-13 – 2020-04-14 (×4): 15 mg via INTRAVENOUS
  Filled 2020-04-13 (×4): qty 1

## 2020-04-13 MED ORDER — BUPIVACAINE-EPINEPHRINE (PF) 0.25% -1:200000 IJ SOLN
INTRAMUSCULAR | Status: AC
  Filled 2020-04-13: qty 30

## 2020-04-13 MED ORDER — DEXAMETHASONE SODIUM PHOSPHATE 10 MG/ML IJ SOLN
INTRAMUSCULAR | Status: DC | PRN
  Administered 2020-04-13: 10 mg via INTRAVENOUS

## 2020-04-13 MED ORDER — MENTHOL 3 MG MT LOZG: 1.0000 | LOZENGE | OROMUCOSAL | Status: DC | PRN

## 2020-04-13 MED ORDER — FLUOXETINE HCL 20 MG PO CAPS
40.0000 mg | ORAL_CAPSULE | Freq: Every day | ORAL | Status: DC
  Administered 2020-04-13 – 2020-04-14 (×2): 40 mg via ORAL
  Filled 2020-04-13 (×2): qty 2

## 2020-04-13 MED ORDER — TRANEXAMIC ACID-NACL 1000-0.7 MG/100ML-% IV SOLN
1000.0000 mg | Freq: Once | INTRAVENOUS | Status: AC
  Administered 2020-04-13: 13:00:00 1000 mg via INTRAVENOUS
  Filled 2020-04-13: qty 100

## 2020-04-13 MED ORDER — ACETAMINOPHEN 500 MG PO TABS
1000.0000 mg | ORAL_TABLET | Freq: Once | ORAL | Status: AC
  Administered 2020-04-13: 1000 mg via ORAL
  Filled 2020-04-13: qty 2

## 2020-04-13 MED ORDER — METOCLOPRAMIDE HCL 5 MG/ML IJ SOLN: 5.0000 mg | Freq: Three times a day (TID) | INTRAMUSCULAR | Status: DC | PRN

## 2020-04-13 MED ORDER — MAGNESIUM CITRATE PO SOLN: 1.0000 | Freq: Once | ORAL | Status: DC | PRN

## 2020-04-13 MED ORDER — POLYETHYLENE GLYCOL 3350 17 G PO PACK: 17.0000 g | PACK | Freq: Every day | ORAL | Status: DC | PRN

## 2020-04-13 MED ORDER — POTASSIUM CHLORIDE IN NACL 20-0.45 MEQ/L-% IV SOLN
INTRAVENOUS | Status: DC
  Filled 2020-04-13 (×2): qty 1000

## 2020-04-13 MED ORDER — PHENOL 1.4 % MT LIQD: 1.0000 | OROMUCOSAL | Status: DC | PRN

## 2020-04-13 MED ORDER — FENTANYL CITRATE (PF) 100 MCG/2ML IJ SOLN
INTRAMUSCULAR | Status: AC
  Filled 2020-04-13: qty 2

## 2020-04-13 MED ORDER — TRANEXAMIC ACID-NACL 1000-0.7 MG/100ML-% IV SOLN
1000.0000 mg | INTRAVENOUS | Status: AC
  Administered 2020-04-13: 1000 mg via INTRAVENOUS
  Filled 2020-04-13: qty 100

## 2020-04-13 MED ORDER — DOCUSATE SODIUM 100 MG PO CAPS
100.0000 mg | ORAL_CAPSULE | Freq: Two times a day (BID) | ORAL | Status: DC
  Administered 2020-04-13 – 2020-04-14 (×2): 100 mg via ORAL
  Filled 2020-04-13 (×2): qty 1

## 2020-04-13 MED ORDER — OXYCODONE HCL 5 MG PO TABS: 5.0000 mg | ORAL_TABLET | ORAL | 0 refills | Status: DC | PRN

## 2020-04-13 MED ORDER — ONDANSETRON HCL 4 MG/2ML IJ SOLN: 4.0000 mg | Freq: Four times a day (QID) | INTRAMUSCULAR | Status: DC | PRN

## 2020-04-13 MED ORDER — MIDAZOLAM HCL 2 MG/2ML IJ SOLN
INTRAMUSCULAR | Status: AC
  Filled 2020-04-13: qty 2

## 2020-04-13 MED ORDER — EPHEDRINE SULFATE-NACL 50-0.9 MG/10ML-% IV SOSY
PREFILLED_SYRINGE | INTRAVENOUS | Status: DC | PRN
  Administered 2020-04-13 (×5): 10 mg via INTRAVENOUS

## 2020-04-13 MED ORDER — MIDAZOLAM HCL 5 MG/5ML IJ SOLN
INTRAMUSCULAR | Status: DC | PRN
  Administered 2020-04-13: 2 mg via INTRAVENOUS

## 2020-04-13 MED ORDER — FENTANYL CITRATE (PF) 100 MCG/2ML IJ SOLN: 25.0000 ug | INTRAMUSCULAR | Status: DC | PRN

## 2020-04-13 MED ORDER — CEFAZOLIN SODIUM-DEXTROSE 2-4 GM/100ML-% IV SOLN
2.0000 g | INTRAVENOUS | Status: AC
  Administered 2020-04-13: 2 g via INTRAVENOUS
  Filled 2020-04-13: qty 100

## 2020-04-13 MED ORDER — FENTANYL CITRATE (PF) 100 MCG/2ML IJ SOLN
INTRAMUSCULAR | Status: DC | PRN
  Administered 2020-04-13: 100 ug via INTRAVENOUS

## 2020-04-13 MED ORDER — DIPHENHYDRAMINE HCL 12.5 MG/5ML PO ELIX
12.5000 mg | ORAL_SOLUTION | ORAL | Status: DC | PRN
Start: 2020-04-13 — End: 2020-04-14

## 2020-04-13 MED ORDER — METHOCARBAMOL 500 MG IVPB - SIMPLE MED
500.0000 mg | Freq: Four times a day (QID) | INTRAVENOUS | Status: DC | PRN
  Filled 2020-04-13: qty 50

## 2020-04-13 MED ORDER — OXYCODONE HCL 5 MG PO TABS
10.0000 mg | ORAL_TABLET | ORAL | Status: DC | PRN
Start: 2020-04-13 — End: 2020-04-14

## 2020-04-13 MED ORDER — BUPIVACAINE HCL (PF) 0.5 % IJ SOLN
INTRAMUSCULAR | Status: DC | PRN
  Administered 2020-04-13: 2.5 mL

## 2020-04-13 MED ORDER — ASPIRIN EC 325 MG PO TBEC: 325.0000 mg | DELAYED_RELEASE_TABLET | Freq: Two times a day (BID) | ORAL | 0 refills | Status: DC

## 2020-04-13 MED ORDER — OXYCODONE HCL 5 MG PO TABS
5.0000 mg | ORAL_TABLET | ORAL | Status: DC | PRN
  Administered 2020-04-13 (×3): 5 mg via ORAL
  Administered 2020-04-14: 10 mg via ORAL
  Filled 2020-04-13: qty 1
  Filled 2020-04-13: qty 2
  Filled 2020-04-13 (×2): qty 1

## 2020-04-13 SURGICAL SUPPLY — 60 items
BALL HIP CERAMIC (Hips) IMPLANT
BIT DRILL 2.0X128 (BIT) ×2 IMPLANT
BIT DRILL 2.0X128MM (BIT) ×1
BLADE SAW SAG 73X25 THK (BLADE) ×2
BLADE SAW SGTL 73X25 THK (BLADE) ×1 IMPLANT
CLOSURE STERI-STRIP 1/2X4 (GAUZE/BANDAGES/DRESSINGS) ×2
CLSR STERI-STRIP ANTIMIC 1/2X4 (GAUZE/BANDAGES/DRESSINGS) ×4 IMPLANT
COVER SURGICAL LIGHT HANDLE (MISCELLANEOUS) ×3 IMPLANT
COVER WAND RF STERILE (DRAPES) IMPLANT
CUP SECTOR GRIPTON 50MM (Cup) ×2 IMPLANT
DRAPE INCISE IOBAN 66X45 STRL (DRAPES) ×3 IMPLANT
DRAPE ORTHO SPLIT 77X108 STRL (DRAPES) ×6
DRAPE POUCH INSTRU U-SHP 10X18 (DRAPES) ×3 IMPLANT
DRAPE SHEET LG 3/4 BI-LAMINATE (DRAPES) ×3 IMPLANT
DRAPE SURG 17X11 SM STRL (DRAPES) ×3 IMPLANT
DRAPE SURG ORHT 6 SPLT 77X108 (DRAPES) ×2 IMPLANT
DRAPE U-SHAPE 47X51 STRL (DRAPES) ×3 IMPLANT
DRSG AQUACEL AG ADV 3.5X 6 (GAUZE/BANDAGES/DRESSINGS) ×2 IMPLANT
DRSG MEPILEX BORDER 4X8 (GAUZE/BANDAGES/DRESSINGS) ×3 IMPLANT
DURAPREP 26ML APPLICATOR (WOUND CARE) ×6 IMPLANT
ELECT BLADE TIP CTD 4 INCH (ELECTRODE) ×3 IMPLANT
ELECT REM PT RETURN 15FT ADLT (MISCELLANEOUS) ×3 IMPLANT
ELIMINATOR HOLE APEX DEPUY (Hips) ×2 IMPLANT
FACESHIELD WRAPAROUND (MASK) ×3 IMPLANT
GLOVE BIO SURGEON STRL SZ7 (GLOVE) ×3 IMPLANT
GLOVE BIO SURGEON STRL SZ7.5 (GLOVE) ×3 IMPLANT
GLOVE BIOGEL PI IND STRL 8 (GLOVE) ×1 IMPLANT
GLOVE BIOGEL PI INDICATOR 8 (GLOVE) ×2
GLOVE INDICATOR 6.5 STRL GRN (GLOVE) ×3 IMPLANT
GOWN STRL REUS W/TWL LRG LVL3 (GOWN DISPOSABLE) ×6 IMPLANT
HIP BALL CERAMIC (Hips) ×3 IMPLANT
HOOD PEEL AWAY FLYTE STAYCOOL (MISCELLANEOUS) ×9 IMPLANT
KIT BASIN OR (CUSTOM PROCEDURE TRAY) ×3 IMPLANT
KIT TURNOVER KIT A (KITS) ×3 IMPLANT
LINER ACET PNNCL PLUS4 NEUTRAL (Hips) ×1 IMPLANT
MANIFOLD NEPTUNE II (INSTRUMENTS) ×3 IMPLANT
NDL SAFETY ECLIPSE 18X1.5 (NEEDLE) ×2 IMPLANT
NEEDLE HYPO 18GX1.5 SHARP (NEEDLE) ×6
NEEDLE MA TROC 1/2 (NEEDLE) IMPLANT
NS IRRIG 1000ML POUR BTL (IV SOLUTION) ×3 IMPLANT
PACK TOTAL JOINT (CUSTOM PROCEDURE TRAY) ×3 IMPLANT
PENCIL SMOKE EVACUATOR (MISCELLANEOUS) IMPLANT
PINNACLE PLUS 4 NEUTRAL (Hips) ×3 IMPLANT
PROTECTOR NERVE ULNAR (MISCELLANEOUS) ×3 IMPLANT
RETRIEVER SUT HEWSON (MISCELLANEOUS) ×3 IMPLANT
SCREW 6.5MMX25MM (Screw) ×2 IMPLANT
SUCTION FRAZIER HANDLE 12FR (TUBING) ×3
SUCTION TUBE FRAZIER 12FR DISP (TUBING) ×1 IMPLANT
SUT FIBERWIRE #2 38 REV NDL BL (SUTURE) ×9
SUT VIC AB 0 CT1 36 (SUTURE) ×3 IMPLANT
SUT VIC AB 1 CT1 36 (SUTURE) ×6 IMPLANT
SUT VIC AB 2-0 CT1 27 (SUTURE) ×6
SUT VIC AB 2-0 CT1 TAPERPNT 27 (SUTURE) ×2 IMPLANT
SUT VIC AB 3-0 SH 8-18 (SUTURE) ×3 IMPLANT
SUTURE FIBERWR#2 38 REV NDL BL (SUTURE) ×3 IMPLANT
SYR CONTROL 10ML LL (SYRINGE) ×6 IMPLANT
TAP DUOFIX SZ5 STD OFF (Hips) ×2 IMPLANT
TOWEL OR 17X26 10 PK STRL BLUE (TOWEL DISPOSABLE) ×3 IMPLANT
TRAY FOLEY MTR SLVR 16FR STAT (SET/KITS/TRAYS/PACK) ×3 IMPLANT
WATER STERILE IRR 1000ML POUR (IV SOLUTION) ×6 IMPLANT

## 2020-04-13 NOTE — Evaluation (Signed)
Physical Therapy Evaluation Patient Details Name: Jane Brown MRN: [id number] DOB: 03/15/1956 Today's Date: 04/13/2020   History of Present Illness  64 yo female s/p R THA-posterior 04/13/20. Hx of R UKA 2019, RLS, L UKA  Clinical Impression  On eval POD 0, pt required Min assist for mobility. She walked ~25 feet with a RW. Pain rated 7/10 with activity. Reviewed posterior hip precautions. Assisted pt back to bed at her request. Will plan to follow and progress activity as tolerated. Plan is for d/c home tomorrow if pt meets her PT goals.     Follow Up Recommendations Follow surgeon's recommendation for DC plan and follow-up therapies;Home health PT    Equipment Recommendations  None recommended by PT    Recommendations for Other Services       Precautions / Restrictions Precautions Precautions: Fall;Posterior Hip Precaution Comments: Reviewed hip precautions Restrictions Weight Bearing Restrictions: No Other Position/Activity Restrictions: WBAT      Mobility  Bed Mobility Overal bed mobility: Needs Assistance Bed Mobility: Supine to Sit;Sit to Supine     Supine to sit: Min assist;HOB elevated Sit to supine: Min assist;HOB elevated   General bed mobility comments: Cues for safety, technique, sequence, adherence to precautions. Increased time. Assist for R LE.    Transfers Overall transfer level: Needs assistance Equipment used: Rolling walker (2 wheeled) Transfers: Sit to/from Stand Sit to Stand: Min assist;From elevated surface         General transfer comment: VCs safety, technique, hand/LE placement. Assist to rise, stabilize, control descent.  Ambulation/Gait Ambulation/Gait assistance: Min assist Gait Distance (Feet): 25 Feet Assistive device: Rolling walker (2 wheeled) Gait Pattern/deviations: Step-to pattern;Step-through pattern;Decreased stride length     General Gait Details: Cues for safety, sequence, posture. Assist to stabilize throughout  distance. Distance limited by pain.  Stairs            Wheelchair Mobility    Modified Rankin (Stroke Patients Only)       Balance Overall balance assessment: Needs assistance         Standing balance support: Bilateral upper extremity supported Standing balance-Leahy Scale: Poor                               Pertinent Vitals/Pain Pain Assessment: 0-10 Pain Score: 7  Pain Location: R hip Pain Descriptors / Indicators: Guarding;Discomfort;Sore;Aching;Sharp Pain Intervention(s): Limited activity within patient's tolerance;Monitored during session;Repositioned    Home Living Family/patient expects to be discharged to:: Private residence Living Arrangements: Alone Available Help at Discharge: Family;Available 24 hours/day Type of Home: House Home Access: Level entry     Home Layout: One level Home Equipment: Walker - 2 wheels;Cane - single point;Shower seat Additional Comments: has a high bed to get in/out off unless she sleeps in secondary bedroom    Prior Function Level of Independence: Independent               Hand Dominance        Extremity/Trunk Assessment   Upper Extremity Assessment Upper Extremity Assessment: Overall WFL for tasks assessed    Lower Extremity Assessment Lower Extremity Assessment: Generalized weakness    Cervical / Trunk Assessment Cervical / Trunk Assessment: Normal  Communication   Communication: No difficulties  Cognition Arousal/Alertness: Awake/alert Behavior During Therapy: WFL for tasks assessed/performed Overall Cognitive Status: Within Functional Limits for tasks assessed  General Comments      Exercises     Assessment/Plan    PT Assessment Patient needs continued PT services  PT Problem List Decreased strength;Decreased mobility;Decreased range of motion;Decreased activity tolerance;Decreased balance;Decreased knowledge of use of  DME;Decreased knowledge of precautions;Pain       PT Treatment Interventions DME instruction;Gait training;Therapeutic activities;Therapeutic exercise;Patient/family education;Balance training;Functional mobility training    PT Goals (Current goals can be found in the Care Plan section)  Acute Rehab PT Goals Patient Stated Goal: less pain PT Goal Formulation: With patient Time For Goal Achievement: 04/27/20 Potential to Achieve Goals: Good    Frequency 7X/week   Barriers to discharge        Co-evaluation               AM-PAC PT "6 Clicks" Mobility  Outcome Measure Help needed turning from your back to your side while in a flat bed without using bedrails?: A Little Help needed moving from lying on your back to sitting on the side of a flat bed without using bedrails?: A Little Help needed moving to and from a bed to a chair (including a wheelchair)?: A Little Help needed standing up from a chair using your arms (e.g., wheelchair or bedside chair)?: A Little Help needed to walk in hospital room?: A Little Help needed climbing 3-5 steps with a railing? : A Little 6 Click Score: 18    End of Session Equipment Utilized During Treatment: Gait belt Activity Tolerance: Patient limited by pain Patient left: in bed;with call bell/phone within reach;with bed alarm set;with family/visitor present   PT Visit Diagnosis: Pain;Other abnormalities of gait and mobility (R26.89) Pain - Right/Left: Right Pain - part of body: Hip    Time: 1194-1740 PT Time Calculation (min) (ACUTE ONLY): 28 min   Charges:   PT Evaluation $PT Eval Low Complexity: 1 Low PT Treatments $Gait Training: 8-22 mins           Doreatha Massed, PT Acute Rehabilitation  Office: [phone] Pager: [phone]

## 2020-04-13 NOTE — Op Note (Signed)
04/13/2020  9:53 AM  PATIENT:  Jane Brown   MRN: [id number]  PRE-OPERATIVE DIAGNOSIS: Right hip primary localized osteoarthritis  POST-OPERATIVE DIAGNOSIS:  same  PROCEDURE:  Procedure(s): RIGHT TOTAL HIP ARTHROPLASTY  PREOPERATIVE INDICATIONS:    Jane Brown is an 64 y.o. female who has a diagnosis of right hip primary localized osteoarthritis and elected for surgical management after failing conservative treatment.  The risks benefits and alternatives were discussed with the patient including but not limited to the risks of nonoperative treatment, versus surgical intervention including infection, bleeding, nerve injury, periprosthetic fracture, the need for revision surgery, dislocation, leg length discrepancy, blood clots, cardiopulmonary complications, morbidity, mortality, among others, and they were willing to proceed.     OPERATIVE REPORT     SURGEON:  [name], MD    ASSISTANT:  Merlene Pulling, PA-C, (Present throughout the entire procedure,  necessary for completion of procedure in a timely manner, assisting with retraction, instrumentation, and closure)     ANESTHESIA: Spinal  ESTIMATED BLOOD LOSS: 814 mL    COMPLICATIONS:  None.     UNIQUE ASPECTS OF THE CASE: She had a valgus configuration on her femoral neck.  I had reasonably good exposure to the acetabulum, and fairly significant bleeding bone although I did not completely medialize.  Initially my cup was a little bit on the flat side, and I repositioned this to have appropriate inclination.  I had just a small amount of anterior wall exposed.  Initially the cup did not want to stick, but ultimately sat down well.  Her leg lengths felt equal, I had good soft tissue coverage to the bone, she still had about 1 to 2 mm of shuck, but had excellent stability with no posterior impingement.  She did ream to a 6 on the femur, but the 5 was fairly tight, and she looks smaller proximally than she was  distally.  COMPONENTS:  [employer] fit femur size 5 with a 32 mm + 5 ceramic head ball and a Gription Acetabular shell size 50, with a single cancellous screw for backup fixation, with an apex hole eliminator and a +4 neutral polyethylene liner.    PROCEDURE IN DETAIL:   The patient was met in the holding area and  identified.  The appropriate hip was identified and marked at the operative site.  The patient was then transported to the OR  and  placed under anesthesia.  At that point, the patient was  placed in the lateral decubitus position with the operative side up and  secured to the operating room table and all bony prominences padded.     The operative lower extremity was prepped from the iliac crest to the distal leg.  Sterile draping was performed.  Time out was performed prior to incision.      A routine posterolateral approach was utilized via sharp dissection  carried down to the subcutaneous tissue.  Gross bleeders were Bovie coagulated.  The iliotibial band was identified and incised along the length of the skin incision.  Self-retaining retractors were  inserted.  With the hip internally rotated, the short external rotators  were identified. The piriformis and capsule was tagged with FiberWire, and the hip capsule released in a T-type fashion.  The femoral neck was exposed, and I resected the femoral neck using the appropriate jig. This was performed at approximately a thumb's breadth above the lesser trochanter.    I then exposed the deep acetabulum, cleared out any tissue  including the ligamentum teres.  A wing retractor was placed.  After adequate visualization, I excised the labrum, and then sequentially reamed.  I placed the trial acetabulum, which seated nicely, and then impacted the real cup into place.  Appropriate version and inclination was confirmed clinically matching their bony anatomy, and also with the use of the jig.  I placed a cancellous screw to augment  fixation.  A trial polyethylene liner was placed and the wing retractor removed.    I then prepared the proximal femur using the cookie-cutter, the lateralizing reamer, and then sequentially reamed and broached.  A trial broach, neck, and head was utilized, and I reduced the hip and it was found to have excellent stability with functional range of motion. The trial components were then removed, and the real polyethylene liner was placed.  I then impacted the real femoral prosthesis into place into the appropriate version, slightly anteverted to the normal anatomy, and I impacted the real head ball into place. The hip was then reduced and taken through functional range of motion and found to have excellent stability. Leg lengths were restored.  I then used a 2 mm drill bits to pass the FiberWire suture from the capsule and piriformis through the greater trochanter, and secured this. Excellent posterior capsular repair was achieved. I also closed the T in the capsule.  I then irrigated the hip copiously again with pulse lavage, and repaired the fascia with Vicryl, followed by Vicryl for the subcutaneous tissue, Monocryl for the skin, Steri-Strips and sterile gauze. The wounds were injected. The patient was then awakened and returned to PACU in stable and satisfactory condition. There were no complications.  [name], MD Orthopedic Surgeon [phone]   04/13/2020 9:53 AM

## 2020-04-13 NOTE — Anesthesia Postprocedure Evaluation (Signed)
Anesthesia Post Note  Patient: DEMETRICE AMSTUTZ  Procedure(s) Performed: TOTAL HIP ARTHROPLASTY (Right Hip)     Patient location during evaluation: PACU Anesthesia Type: MAC and Spinal Level of consciousness: oriented and awake and alert Pain management: pain level controlled Vital Signs Assessment: post-procedure vital signs reviewed and stable Respiratory status: spontaneous breathing, respiratory function stable and patient connected to nasal cannula oxygen Cardiovascular status: blood pressure returned to baseline and stable Postop Assessment: no headache, no backache and no apparent nausea or vomiting Anesthetic complications: no   No complications documented.  Last Vitals:  Vitals:   04/13/20 1130 04/13/20 1227  BP: 121/63 101/84  Pulse: 65 67  Resp: 18 16  Temp: 36.8 C 36.5 C  SpO2: 100% 100%    Last Pain:  Vitals:   04/13/20 1227  TempSrc: Oral  PainSc:                  March Rummage Joi Leyva

## 2020-04-13 NOTE — Progress Notes (Signed)
Orthopedic Tech Progress Note Patient Details:  Jane Brown 1955-09-01 [id number]  Ortho Devices Ortho Device/Splint Location: applied overhead frame to bed Ortho Device/Splint Interventions: Ordered,Application   Post Interventions Patient Tolerated: Well Instructions Provided: Care of device   Braulio Bosch 04/13/2020, 11:58 AM

## 2020-04-13 NOTE — Plan of Care (Signed)
  Problem: Education: Goal: Knowledge of General Education information will improve Description: Including pain rating scale, medication(s)/side effects and non-pharmacologic comfort measures Outcome: Progressing   Problem: Health Behavior/Discharge Planning: Goal: Ability to manage health-related needs will improve Outcome: Progressing   Problem: Activity: Goal: Risk for activity intolerance will decrease Outcome: Progressing   Problem: Nutrition: Goal: Adequate nutrition will be maintained Outcome: Progressing   Problem: Elimination: Goal: Will not experience complications related to bowel motility Outcome: Progressing   Problem: Pain Managment: Goal: General experience of comfort will improve Outcome: Progressing   Problem: Safety: Goal: Ability to remain free from injury will improve Outcome: Progressing   Problem: Education: Goal: Knowledge of the prescribed therapeutic regimen will improve Outcome: Progressing   Problem: Activity: Goal: Ability to avoid complications of mobility impairment will improve Outcome: Progressing Goal: Ability to tolerate increased activity will improve Outcome: Progressing   Problem: Pain Management: Goal: Pain level will decrease with appropriate interventions Outcome: Progressing

## 2020-04-13 NOTE — Anesthesia Procedure Notes (Signed)
Spinal  Patient location during procedure: OR End time: 04/13/2020 7:37 AM Staffing Performed: anesthesiologist and resident/CRNA  Resident/CRNA: Antario Yasuda D, CRNA Preanesthetic Checklist Completed: patient identified, IV checked, site marked, risks and benefits discussed, surgical consent, monitors and equipment checked, pre-op evaluation and timeout performed Spinal Block Patient position: sitting Prep: Betadine Patient monitoring: heart rate, continuous pulse ox and blood pressure Approach: midline Location: L2-3 Injection technique: single-shot Needle Needle type: Sprotte  Needle gauge: 24 G Needle length: 9 cm Assessment Sensory level: T6 Additional Notes Expiration date of kit checked and confirmed. Patient tolerated procedure well, without complications.

## 2020-04-13 NOTE — Plan of Care (Signed)
  Problem: Activity: Goal: Risk for activity intolerance will decrease Outcome: Progressing   Problem: Nutrition: Goal: Adequate nutrition will be maintained Outcome: Progressing   Problem: Pain Managment: Goal: General experience of comfort will improve Outcome: Progressing   

## 2020-04-13 NOTE — Anesthesia Preprocedure Evaluation (Signed)
Anesthesia Evaluation  Patient identified by MRN, date of birth, ID band Patient awake  General Assessment Comment:PONV in mother  Reviewed: Allergy & Precautions, NPO status , Patient's Chart, lab work & pertinent test results  Airway Mallampati: II  TM Distance: >3 FB Neck ROM: Full    Dental  (+) Partial Lower, Upper Dentures   Pulmonary neg pulmonary ROS, former smoker,    Pulmonary exam normal        Cardiovascular hypertension, Pt. on medications  Rhythm:Regular Rate:Normal     Neuro/Psych Anxiety Depression negative neurological ROS     GI/Hepatic Neg liver ROS, GERD  Medicated and Controlled,  Endo/Other  diabetes, Well Controlled, Type 2, Oral Hypoglycemic Agents  Renal/GU negative Renal ROS  negative genitourinary   Musculoskeletal  (+) Arthritis , Osteoarthritis,    Abdominal (+)  Abdomen: soft. Bowel sounds: normal.  Peds  Hematology negative hematology ROS (+)   Anesthesia Other Findings   Reproductive/Obstetrics                             Anesthesia Physical Anesthesia Plan  ASA: II  Anesthesia Plan: MAC and Spinal   Post-op Pain Management:    Induction:   PONV Risk Score and Plan: 2 and Ondansetron, Dexamethasone and Propofol infusion  Airway Management Planned: Simple Face Mask, Natural Airway and Nasal Cannula  Additional Equipment: None  Intra-op Plan:   Post-operative Plan:   Informed Consent: I have reviewed the patients History and Physical, chart, labs and discussed the procedure including the risks, benefits and alternatives for the proposed anesthesia with the patient or authorized representative who has indicated his/her understanding and acceptance.     Dental advisory given  Plan Discussed with: CRNA  Anesthesia Plan Comments: (Lab Results      Component                Value               Date                      WBC                       6.5                 04/05/2020                HGB                      13.0                04/05/2020                HCT                      40.4                04/05/2020                MCV                      87.4                04/05/2020                PLT  172                 04/05/2020          )        Anesthesia Quick Evaluation

## 2020-04-13 NOTE — Discharge Instructions (Signed)
INSTRUCTIONS AFTER JOINT REPLACEMENT   o Remove items at home which could result in a fall. This includes throw rugs or furniture in walking pathways o ICE to the affected joint every three hours while awake for 30 minutes at a time, for at least the first 3-5 days, and then as needed for pain and swelling.  Continue to use ice for pain and swelling. You may notice swelling that will progress down to the foot and ankle.  This is normal after surgery.  Elevate your leg when you are not up walking on it.   o Continue to use the breathing machine you got in the hospital (incentive spirometer) which will help keep your temperature down.  It is common for your temperature to cycle up and down following surgery, especially at night when you are not up moving around and exerting yourself.  The breathing machine keeps your lungs expanded and your temperature down.   DIET:  As you were doing prior to hospitalization, we recommend a well-balanced diet.  DRESSING / WOUND CARE / SHOWERING  You may change your dressing 3-5 days after surgery.  Then change the dressing every day with sterile gauze.  Please use good hand washing techniques before changing the dressing.  Do not use any lotions or creams on the incision until instructed by your surgeon.  ACTIVITY  o Increase activity slowly as tolerated, but follow the weight bearing instructions below.   o No driving for 6 weeks or until further direction given by your physician.  You cannot drive while taking narcotics.  o No lifting or carrying greater than 10 lbs. until further directed by your surgeon. o Avoid periods of inactivity such as sitting longer than an hour when not asleep. This helps prevent blood clots.  o You may return to work once you are authorized by your doctor.     WEIGHT BEARING   Weight bearing as tolerated with assist device (walker, cane, etc) as directed, use it as long as suggested by your surgeon or therapist, typically at  least 4-6 weeks.   EXERCISES  Results after joint replacement surgery are often greatly improved when you follow the exercise, range of motion and muscle strengthening exercises prescribed by your doctor. Safety measures are also important to protect the joint from further injury. Any time any of these exercises cause you to have increased pain or swelling, decrease what you are doing until you are comfortable again and then slowly increase them. If you have problems or questions, call your caregiver or physical therapist for advice.   Rehabilitation is important following a joint replacement. After just a few days of immobilization, the muscles of the leg can become weakened and shrink (atrophy).  These exercises are designed to build up the tone and strength of the thigh and leg muscles and to improve motion. Often times heat used for twenty to thirty minutes before working out will loosen up your tissues and help with improving the range of motion but do not use heat for the first two weeks following surgery (sometimes heat can increase post-operative swelling).   These exercises can be done on a training (exercise) mat, on the floor, on a table or on a bed. Use whatever works the best and is most comfortable for you.    Use music or television while you are exercising so that the exercises are a pleasant break in your day. This will make your life better with the exercises acting as a break   in your routine that you can look forward to.   Perform all exercises about fifteen times, three times per day or as directed.  You should exercise both the operative leg and the other leg as well.  Exercises include:   . Quad Sets - Tighten up the muscle on the front of the thigh (Quad) and hold for 5-10 seconds.   . Straight Leg Raises - With your knee straight (if you were given a brace, keep it on), lift the leg to 60 degrees, hold for 3 seconds, and slowly lower the leg.  Perform this exercise against  resistance later as your leg gets stronger.  . Leg Slides: Lying on your back, slowly slide your foot toward your buttocks, bending your knee up off the floor (only go as far as is comfortable). Then slowly slide your foot back down until your leg is flat on the floor again.  . Angel Wings: Lying on your back spread your legs to the side as far apart as you can without causing discomfort.  . Hamstring Strength:  Lying on your back, push your heel against the floor with your leg straight by tightening up the muscles of your buttocks.  Repeat, but this time bend your knee to a comfortable angle, and push your heel against the floor.  You may put a pillow under the heel to make it more comfortable if necessary.   A rehabilitation program following joint replacement surgery can speed recovery and prevent re-injury in the future due to weakened muscles. Contact your doctor or a physical therapist for more information on knee rehabilitation.    CONSTIPATION  Constipation is defined medically as fewer than three stools per week and severe constipation as less than one stool per week.  Even if you have a regular bowel pattern at home, your normal regimen is likely to be disrupted due to multiple reasons following surgery.  Combination of anesthesia, postoperative narcotics, change in appetite and fluid intake all can affect your bowels.   YOU MUST use at least one of the following options; they are listed in order of increasing strength to get the job done.  They are all available over the counter, and you may need to use some, POSSIBLY even all of these options:    Drink plenty of fluids (prune juice may be helpful) and high fiber foods Colace 100 mg by mouth twice a day  Senokot for constipation as directed and as needed Dulcolax (bisacodyl), take with full glass of water  Miralax (polyethylene glycol) once or twice a day as needed.  If you have tried all these things and are unable to have a bowel  movement in the first 3-4 days after surgery call either your surgeon or your primary doctor.    If you experience loose stools or diarrhea, hold the medications until you stool forms back up.  If your symptoms do not get better within 1 week or if they get worse, check with your doctor.  If you experience "the worst abdominal pain ever" or develop nausea or vomiting, please contact the office immediately for further recommendations for treatment.   ITCHING:  If you experience itching with your medications, try taking only a single pain pill, or even half a pain pill at a time.  You can also use Benadryl over the counter for itching or also to help with sleep.   TED HOSE STOCKINGS:  Use stockings on both legs until for at least 2 weeks or as   directed by physician office. They may be removed at night for sleeping.  MEDICATIONS:  See your medication summary on the "After Visit Summary" that nursing will review with you.  You may have some home medications which will be placed on hold until you complete the course of blood thinner medication.  It is important for you to complete the blood thinner medication as prescribed.  PRECAUTIONS:  If you experience chest pain or shortness of breath - call 911 immediately for transfer to the hospital emergency department.   If you develop a fever greater that 101 F, purulent drainage from wound, increased redness or drainage from wound, foul odor from the wound/dressing, or calf pain - CONTACT YOUR SURGEON.                                                   FOLLOW-UP APPOINTMENTS:  If you do not already have a post-op appointment, please call the office for an appointment to be seen by your surgeon.  Guidelines for how soon to be seen are listed in your "After Visit Summary", but are typically between 1-4 weeks after surgery.  OTHER INSTRUCTIONS:   DENTAL ANTIBIOTICS:  In most cases prophylactic antibiotics for Dental procdeures after total joint surgery are  not necessary.  Exceptions are as follows:  1. History of prior total joint infection  2. Severely immunocompromised (Organ Transplant, cancer chemotherapy, Rheumatoid biologic meds such as [place])  3. Poorly controlled diabetes (A1C &gt; 8.0, blood glucose over 200)  If you have one of these conditions, contact your surgeon for an antibiotic prescription, prior to your dental procedure.   MAKE SURE YOU:  . Understand these instructions.  . Get help right away if you are not doing well or get worse.    Thank you for letting us be a part of your medical care team.  It is a privilege we respect greatly.  We hope these instructions will help you stay on track for a fast and full recovery!

## 2020-04-13 NOTE — Interval H&P Note (Signed)
History and Physical Interval Note:  04/13/2020 7:15 AM  Jane Brown  has presented today for surgery, with the diagnosis of djd right hip.  The various methods of treatment have been discussed with the patient and family. After consideration of risks, benefits and other options for treatment, the patient has consented to  Procedure(s): TOTAL HIP ARTHROPLASTY (Right) as a surgical intervention.  The patient's history has been reviewed, patient examined, no change in status, stable for surgery.  I have reviewed the patient's chart and labs.  Questions were answered to the patient's satisfaction.     [name]

## 2020-04-13 NOTE — Transfer of Care (Signed)
Immediate Anesthesia Transfer of Care Note  Patient: Jane Brown  Procedure(s) Performed: TOTAL HIP ARTHROPLASTY (Right Hip)  Patient Location: PACU  Anesthesia Type:Regional  Level of Consciousness: awake, alert  and oriented  Airway & Oxygen Therapy: Patient Spontanous Breathing and Patient connected to face mask oxygen  Post-op Assessment: Report given to RN and Post -op Vital signs reviewed and stable  Post vital signs: Reviewed and stable  Last Vitals:  Vitals Value Taken Time  BP 115/70 04/13/20 0942  Temp    Pulse 66 04/13/20 0945  Resp 15 04/13/20 0945  SpO2 100 % 04/13/20 0945  Vitals shown include unvalidated device data.  Last Pain:  Vitals:   04/13/20 0548  TempSrc:   PainSc: 5          Complications: No complications documented.

## 2020-04-14 DIAGNOSIS — Z96653 Presence of artificial knee joint, bilateral: Secondary | ICD-10-CM | POA: Diagnosis not present

## 2020-04-14 DIAGNOSIS — I10 Essential (primary) hypertension: Secondary | ICD-10-CM | POA: Diagnosis not present

## 2020-04-14 DIAGNOSIS — E119 Type 2 diabetes mellitus without complications: Secondary | ICD-10-CM | POA: Diagnosis not present

## 2020-04-14 DIAGNOSIS — Z791 Long term (current) use of non-steroidal anti-inflammatories (NSAID): Secondary | ICD-10-CM | POA: Diagnosis not present

## 2020-04-14 DIAGNOSIS — Z79899 Other long term (current) drug therapy: Secondary | ICD-10-CM | POA: Diagnosis not present

## 2020-04-14 DIAGNOSIS — M1611 Unilateral primary osteoarthritis, right hip: Secondary | ICD-10-CM | POA: Diagnosis not present

## 2020-04-14 DIAGNOSIS — Z7952 Long term (current) use of systemic steroids: Secondary | ICD-10-CM | POA: Diagnosis not present

## 2020-04-14 DIAGNOSIS — Z7984 Long term (current) use of oral hypoglycemic drugs: Secondary | ICD-10-CM | POA: Diagnosis not present

## 2020-04-14 DIAGNOSIS — Z87891 Personal history of nicotine dependence: Secondary | ICD-10-CM | POA: Diagnosis not present

## 2020-04-14 LAB — BASIC METABOLIC PANEL
Anion gap: 9 (ref 5–15)
BUN: 19 mg/dL (ref 8–23)
CO2: 22 mmol/L (ref 22–32)
Calcium: 8.5 mg/dL — ABNORMAL LOW (ref 8.9–10.3)
Chloride: 106 mmol/L (ref 98–111)
Creatinine, Ser: 0.93 mg/dL (ref 0.44–1.00)
GFR, Estimated: >60 mL/min (ref >60)
Glucose, Bld: 212 mg/dL — ABNORMAL HIGH (ref 70–99)
Potassium: 4.3 mmol/L (ref 3.5–5.1)
Sodium: 137 mmol/L (ref 135–145)

## 2020-04-14 LAB — CBC
HCT: 29.3 % — ABNORMAL LOW (ref 36.0–46.0)
Hemoglobin: 9.7 g/dL — ABNORMAL LOW (ref 12.0–15.0)
MCH: 28.8 pg (ref 26.0–34.0)
MCHC: 33.1 g/dL (ref 30.0–36.0)
MCV: 86.9 fL (ref 80.0–100.0)
Platelets: 157 10*3/uL (ref 150–400)
RBC: 3.37 MIL/uL — ABNORMAL LOW (ref 3.87–5.11)
RDW: 14.5 % (ref 11.5–15.5)
WBC: 9.2 10*3/uL (ref 4.0–10.5)
nRBC: 0 % (ref 0.0–0.2)

## 2020-04-14 NOTE — Progress Notes (Signed)
Subjective: 1 Day Post-Op s/p Procedure(s): TOTAL HIP ARTHROPLASTY   Patient is alert, oriented, laying in bed.  Patient reports pain as mild.   Denies chest pain, SOB, Calf pain. No nausea/vomiting. No other complaints. Felt physical therapy went well yesterday.  Objective:  PE: VITALS:   Vitals:   04/13/20 1833 04/13/20 2215 04/14/20 0233 04/14/20 0700  BP: (!) 104/56 108/62 120/63 (!) 145/77  Pulse: 71 67 67 80  Resp: 16 16 16 16   Temp: 98 F (36.7 C) 97.9 F (36.6 C) 98.2 F (36.8 C) 98.6 F (37 C)  TempSrc: Oral Oral Oral Oral  SpO2: 96% 96% 98% 97%  Weight:      Height:        ABD soft Neurovascular intact Sensation intact distally Intact pulses distally Dorsiflexion/Plantar flexion intact Incision: no drainage  LABS  Results for orders placed or performed during the hospital encounter of 04/13/20 (from the past 24 hour(s))  Glucose, capillary     Status: Abnormal   Collection Time: 04/13/20  9:53 AM  Result Value Ref Range   Glucose-Capillary 112 (H) 70 - 99 mg/dL  CBC     Status: Abnormal   Collection Time: 04/14/20  2:45 AM  Result Value Ref Range   WBC 9.2 4.0 - 10.5 K/uL   RBC 3.37 (L) 3.87 - 5.11 MIL/uL   Hemoglobin 9.7 (L) 12.0 - 15.0 g/dL   HCT 29.3 (L) 36.0 - 46.0 %   MCV 86.9 80.0 - 100.0 fL   MCH 28.8 26.0 - 34.0 pg   MCHC 33.1 30.0 - 36.0 g/dL   RDW 14.5 11.5 - 15.5 %   Platelets 157 150 - 400 K/uL   nRBC 0.0 0.0 - 0.2 %  Basic metabolic panel     Status: Abnormal   Collection Time: 04/14/20  2:45 AM  Result Value Ref Range   Sodium 137 135 - 145 mmol/L   Potassium 4.3 3.5 - 5.1 mmol/L   Chloride 106 98 - 111 mmol/L   CO2 22 22 - 32 mmol/L   Glucose, Bld 212 (H) 70 - 99 mg/dL   BUN 19 8 - 23 mg/dL   Creatinine, Ser 0.93 0.44 - 1.00 mg/dL   Calcium 8.5 (L) 8.9 - 10.3 mg/dL   GFR, Estimated >60 >60 mL/min   Anion gap 9 5 - 15    DG Pelvis Portable  Result Date: 04/13/2020 CLINICAL DATA:  Status post right total hip  arthroplasty. EXAM: PORTABLE PELVIS 1-2 VIEWS COMPARISON:  None. FINDINGS: Postoperative changes from right total hip arthroplasty identified. The hardware components are in anatomic alignment. No periprosthetic fracture or dislocation. Mild degenerative changes are noted in the left hip. IMPRESSION: Status post right total hip arthroplasty. Electronically Signed   By: Kerby Moors M.D.   On: 04/13/2020 10:57   DG Hip Port Unilat With Pelvis 1V Right  Result Date: 04/13/2020 CLINICAL DATA:  Status post right hip arthroplasty for osteoarthritis. EXAM: DG HIP (WITH OR WITHOUT PELVIS) 2V PORT RIGHT COMPARISON:  08/18/2019 FINDINGS: Two views demonstrate normal alignment of a right hip total arthroplasty. No evidence of fracture or abnormal lucency surrounding the prosthesis. IMPRESSION: Status post right hip arthroplasty with normal alignment. Electronically Signed   By: Aletta Edouard M.D.   On: 04/13/2020 11:01    Assessment/Plan: Principal Problem:   S/P hip replacement, right Active Problems:   Osteoarthritis of right hip   S/P total right hip arthroplasty    1 Day Post-Op  s/p Procedure(s): TOTAL HIP ARTHROPLASTY - doing well, walked 25 feet with therapy yesterday. Patient does not have stairs in her home.   Weightbearing: WBAT RLE Insicional and dressing care: Reinforce dressings as needed VTE prophylaxis: Aspirin 325mg  BID x 30 days Pain control: will d/c with oxycodone, patient has done well with this after previous knee replacements Follow - up plan: 2 weeks with Dr. Mardelle Matte Dispo: ok to discharge if passes PT today. Patient will discharge home where she has family to assist her. She has all needed equipment at home. OPPT is set up at [facility].   Contact information:   Weekdays 8-5 Merlene Pulling, PA-C [phone] A fter hours and holidays please check [email or web address] for group call information for [facility]  Ventura Bruns 04/14/2020, 7:52 AM

## 2020-04-14 NOTE — Progress Notes (Signed)
Physical Therapy Treatment Patient Details Name: Jane Brown MRN: [id number] DOB: 1955-05-07 Today's Date: 04/14/2020    History of Present Illness 64 yo female s/p R THA-posterior 04/13/20. Hx of R UKA 2019, RLS, L UKA    PT Comments    Pt is progressing well. Reviewed/practiced exercises and gait training. Educated on posterior precautions and car transfer technique. See f/u recommendations note below. Feel pt would benefit from continued PT (education and strengthening).  Okay to d/c from PT standpoint-made RN aware.     Follow Up Recommendations  Follow surgeon's recommendation for DC plan and follow-up therapies (per chart, plan is for OP PT. Pt is concerned about co-pay. HHPT may be more suitable. Pt stated she is planning to do her own therapy. She stated she mentioned this to MD.)     Equipment Recommendations  None recommended by PT    Recommendations for Other Services       Precautions / Restrictions Precautions Precautions: Fall;Posterior Hip Precaution Comments: Reviewed hip precautions-pt only able to recall 1/3. Issued precautions handout Restrictions Weight Bearing Restrictions: No Other Position/Activity Restrictions: WBAT    Mobility  Bed Mobility               General bed mobility comments: oob in recliner  Transfers Overall transfer level: Needs assistance Equipment used: Rolling walker (2 wheeled) Transfers: Sit to/from Stand Sit to Stand: Min guard         General transfer comment: VCs safety, technique, hand/LE placement.  Ambulation/Gait Ambulation/Gait assistance: Min guard Gait Distance (Feet): 70 Feet Assistive device: Rolling walker (2 wheeled) Gait Pattern/deviations: Step-to pattern;Step-through pattern;Decreased stride length     General Gait Details: Cues for safety, sequence, posture. Min guard for safety. Distance limited by fatigue.   Stairs             Wheelchair Mobility    Modified Rankin (Stroke  Patients Only)       Balance Overall balance assessment: Needs assistance         Standing balance support: Bilateral upper extremity supported Standing balance-Leahy Scale: Poor                              Cognition Arousal/Alertness: Awake/alert Behavior During Therapy: WFL for tasks assessed/performed Overall Cognitive Status: Within Functional Limits for tasks assessed                                        Exercises Total Joint Exercises Ankle Circles/Pumps: AROM;Both;10 reps Quad Sets: AROM;Both;10 reps Heel Slides: AAROM;Right;10 reps Hip ABduction/ADduction: AAROM;Right;10 reps    General Comments        Pertinent Vitals/Pain Pain Assessment: 0-10 Pain Score: 5  Pain Location: R hip/groin Pain Descriptors / Indicators: Discomfort;Sore;Aching Pain Intervention(s): Monitored during session;Repositioned    Home Living                      Prior Function            PT Goals (current goals can now be found in the care plan section) Progress towards PT goals: Progressing toward goals    Frequency    7X/week      PT Plan Current plan remains appropriate    Co-evaluation              AM-PAC PT "6 Clicks" Mobility  Outcome Measure  Help needed turning from your back to your side while in a flat bed without using bedrails?: A Little Help needed moving from lying on your back to sitting on the side of a flat bed without using bedrails?: A Little Help needed moving to and from a bed to a chair (including a wheelchair)?: A Little Help needed standing up from a chair using your arms (e.g., wheelchair or bedside chair)?: A Little Help needed to walk in hospital room?: A Little Help needed climbing 3-5 steps with a railing? : A Little 6 Click Score: 18    End of Session Equipment Utilized During Treatment: Gait belt Activity Tolerance: Patient tolerated treatment well Patient left: in chair;with call  bell/phone within reach;with family/visitor present   PT Visit Diagnosis: Pain;Other abnormalities of gait and mobility (R26.89) Pain - Right/Left: Right Pain - part of body: Hip     Time: 1610-9604 PT Time Calculation (min) (ACUTE ONLY): 24 min  Charges:  $Gait Training: 8-22 mins $Therapeutic Exercise: 8-22 mins                        Doreatha Massed, PT Acute Rehabilitation  Office: [phone] Pager: [phone]

## 2020-04-14 NOTE — Discharge Summary (Signed)
Discharge Summary  Patient ID: Jane Brown MRN: [id number] DOB/AGE: 63-06-1955 63 y.o.  Admit date: 04/13/2020 Discharge date: 04/14/2020  Admission Diagnoses:  S/P hip replacement, right  Discharge Diagnoses:  Principal Problem:   S/P hip replacement, right Active Problems:   Osteoarthritis of right hip   S/P total right hip arthroplasty   Past Medical History:  Diagnosis Date  . Allergy   . Anxiety   . Depression   . Diabetes mellitus without complication ([facility])    type 2  . Family history of adverse reaction to anesthesia    mother has post-op nausea and vomiting  . GERD (gastroesophageal reflux disease)   . Hemorrhoids   . Hypertension   . Primary localized osteoarthritis of left knee 11/20/2017  . Primary localized osteoarthritis of right knee 03/19/2018  . Rectal bleeding   . Restless leg syndrome   . Wears dentures     Surgeries: Procedure(s): TOTAL HIP ARTHROPLASTY on 04/13/2020   Consultants (if any):   Discharged Condition: Improved  Hospital Course: Jane Brown is an 64 y.o. female who was admitted 04/13/2020 with a diagnosis of S/P hip replacement, right and went to the operating room on 04/13/2020 and underwent the above named procedures.    She was given perioperative antibiotics:  Anti-infectives (From admission, onward)   Start     Dose/Rate Route Frequency Ordered Stop   04/13/20 1400  ceFAZolin (ANCEF) IVPB 2g/100 mL premix        2 g 200 mL/hr over 30 Minutes Intravenous Every 6 hours 04/13/20 1132 04/13/20 2049   04/13/20 0600  ceFAZolin (ANCEF) IVPB 2g/100 mL premix        2 g 200 mL/hr over 30 Minutes Intravenous On call to O.R. 04/13/20 0534 04/13/20 0740    .  She was given sequential compression devices, early ambulation, and aspirin for DVT prophylaxis.  She benefited maximally from the hospital stay and there were no complications.    Recent vital signs:  Vitals:   04/14/20 0233 04/14/20 0700  BP: 120/63 (!) 145/77   Pulse: 67 80  Resp: 16 16  Temp: 98.2 F (36.8 C) 98.6 F (37 C)  SpO2: 98% 97%    Recent laboratory studies:  Lab Results  Component Value Date   HGB 9.7 (L) 04/14/2020   HGB 13.0 04/05/2020   HGB 12.2 03/20/2018   Lab Results  Component Value Date   WBC 9.2 04/14/2020   PLT 157 04/14/2020   No results found for: INR Lab Results  Component Value Date   NA 137 04/14/2020   K 4.3 04/14/2020   CL 106 04/14/2020   CO2 22 04/14/2020   BUN 19 04/14/2020   CREATININE 0.93 04/14/2020   GLUCOSE 212 (H) 04/14/2020    Discharge Medications:   Allergies as of 04/14/2020      Reactions   Hydrocodone    Headaches    Lisinopril Cough   Penicillins Hives, Itching, Other (See Comments)   ALL cillin drugs!!!! Has patient had a PCN reaction causing immediate rash, facial/tongue/throat swelling, SOB or lightheadedness with hypotension: Yes Has patient had a PCN reaction causing severe rash involving mucus membranes or skin necrosis: No Has patient had a PCN reaction that required hospitalization: No Has patient had a PCN reaction occurring within the last 10 years: No If all of the above answers are "NO", then may proceed with Cephalosporin use. tolerated Ancef 04/13/20    Sulfa Antibiotics    Headaches  Medication List    TAKE these medications   aspirin EC 325 MG tablet Take 1 tablet (325 mg total) by mouth 2 (two) times daily.   atorvastatin 20 MG tablet Commonly known as: LIPITOR Take 20 mg by mouth daily.   baclofen 10 MG tablet Commonly known as: LIORESAL Take 1 tablet (10 mg total) by mouth 3 (three) times daily. As needed for muscle spasm   calcium carbonate 1500 (600 Ca) MG Tabs tablet Commonly known as: OSCAL Take 600 mg of elemental calcium by mouth daily.   cholecalciferol 1000 units tablet Commonly known as: VITAMIN D Take 1,000 Units by mouth daily.   clonazePAM 1 MG tablet Commonly known as: KLONOPIN Take 1.5 mg by mouth at bedtime.    FLUoxetine 40 MG capsule Commonly known as: PROZAC Take 40 mg by mouth daily.   metFORMIN 500 MG 24 hr tablet Commonly known as: GLUCOPHAGE-XR Take 500 mg by mouth every evening.   omeprazole 20 MG capsule Commonly known as: PRILOSEC Take 20 mg by mouth daily.   ondansetron 4 MG tablet Commonly known as: Zofran Take 1 tablet (4 mg total) by mouth every 8 (eight) hours as needed for nausea or vomiting.   oxyCODONE 5 MG immediate release tablet Commonly known as: Roxicodone Take 1 tablet (5 mg total) by mouth every 4 (four) hours as needed for severe pain.   sennosides-docusate sodium 8.6-50 MG tablet Commonly known as: SENOKOT-S Take 2 tablets by mouth daily.   valsartan 160 MG tablet Commonly known as: DIOVAN Take 160 mg by mouth daily.   vitamin B-12 1000 MCG tablet Commonly known as: CYANOCOBALAMIN Take 1,000 mcg by mouth daily.       Diagnostic Studies: DG Pelvis Portable  Result Date: 04/13/2020 CLINICAL DATA:  Status post right total hip arthroplasty. EXAM: PORTABLE PELVIS 1-2 VIEWS COMPARISON:  None. FINDINGS: Postoperative changes from right total hip arthroplasty identified. The hardware components are in anatomic alignment. No periprosthetic fracture or dislocation. Mild degenerative changes are noted in the left hip. IMPRESSION: Status post right total hip arthroplasty. Electronically Signed   By: Kerby Moors M.D.   On: 04/13/2020 10:57   DG Hip Port Unilat With Pelvis 1V Right  Result Date: 04/13/2020 CLINICAL DATA:  Status post right hip arthroplasty for osteoarthritis. EXAM: DG HIP (WITH OR WITHOUT PELVIS) 2V PORT RIGHT COMPARISON:  08/18/2019 FINDINGS: Two views demonstrate normal alignment of a right hip total arthroplasty. No evidence of fracture or abnormal lucency surrounding the prosthesis. IMPRESSION: Status post right hip arthroplasty with normal alignment. Electronically Signed   By: Aletta Edouard M.D.   On: 04/13/2020 11:01    Disposition:       Follow-up Information    Jane Bond, MD. Go on 04/26/2020.   Specialty: Orthopedic Surgery Why: Your appoinment is scheduled for 11:00  Contact information: [address] [place] [address] [facility], [place]. Go on 04/15/2020.   Why: You are scheduled to start outpatient physical therapy at 10:00. Please arrive at 9:30 to complete your paperwork  Contact information: [facility] [place] [address] [phone]                Signed: Ventura Bruns PA-C 04/14/2020, 8:03 AM

## 2020-04-15 ENCOUNTER — Encounter (HOSPITAL_COMMUNITY): Payer: Self-pay | Admitting: Orthopedic Surgery

## 2020-04-28 DIAGNOSIS — M1611 Unilateral primary osteoarthritis, right hip: Secondary | ICD-10-CM | POA: Diagnosis not present

## 2020-05-12 DIAGNOSIS — M1712 Unilateral primary osteoarthritis, left knee: Secondary | ICD-10-CM | POA: Diagnosis not present

## 2020-05-12 DIAGNOSIS — E1169 Type 2 diabetes mellitus with other specified complication: Secondary | ICD-10-CM | POA: Diagnosis not present

## 2020-05-12 DIAGNOSIS — K219 Gastro-esophageal reflux disease without esophagitis: Secondary | ICD-10-CM | POA: Diagnosis not present

## 2020-05-12 DIAGNOSIS — E785 Hyperlipidemia, unspecified: Secondary | ICD-10-CM | POA: Diagnosis not present

## 2020-05-12 DIAGNOSIS — I10 Essential (primary) hypertension: Secondary | ICD-10-CM | POA: Diagnosis not present

## 2020-05-12 DIAGNOSIS — F330 Major depressive disorder, recurrent, mild: Secondary | ICD-10-CM | POA: Diagnosis not present

## 2020-05-26 DIAGNOSIS — M1611 Unilateral primary osteoarthritis, right hip: Secondary | ICD-10-CM | POA: Diagnosis not present

## 2020-06-07 DIAGNOSIS — F330 Major depressive disorder, recurrent, mild: Secondary | ICD-10-CM | POA: Diagnosis not present

## 2020-06-07 DIAGNOSIS — I10 Essential (primary) hypertension: Secondary | ICD-10-CM | POA: Diagnosis not present

## 2020-06-07 DIAGNOSIS — G2581 Restless legs syndrome: Secondary | ICD-10-CM | POA: Diagnosis not present

## 2020-06-07 DIAGNOSIS — E1169 Type 2 diabetes mellitus with other specified complication: Secondary | ICD-10-CM | POA: Diagnosis not present

## 2020-06-07 DIAGNOSIS — E669 Obesity, unspecified: Secondary | ICD-10-CM | POA: Diagnosis not present

## 2020-06-07 DIAGNOSIS — E785 Hyperlipidemia, unspecified: Secondary | ICD-10-CM | POA: Diagnosis not present

## 2020-06-18 IMAGING — DX DG KNEE 1-2V PORT*L*
1 series · 2 of 2 positions shown · non-contrast
Comparison: None in PACs

CLINICAL DATA: Status post left partial knee joint replacement.

EXAM:
PORTABLE LEFT KNEE - 1-2 VIEW

[Series 1: knee · 0.14mm/px · 2 of 2 slices shown]
[im 1/2]
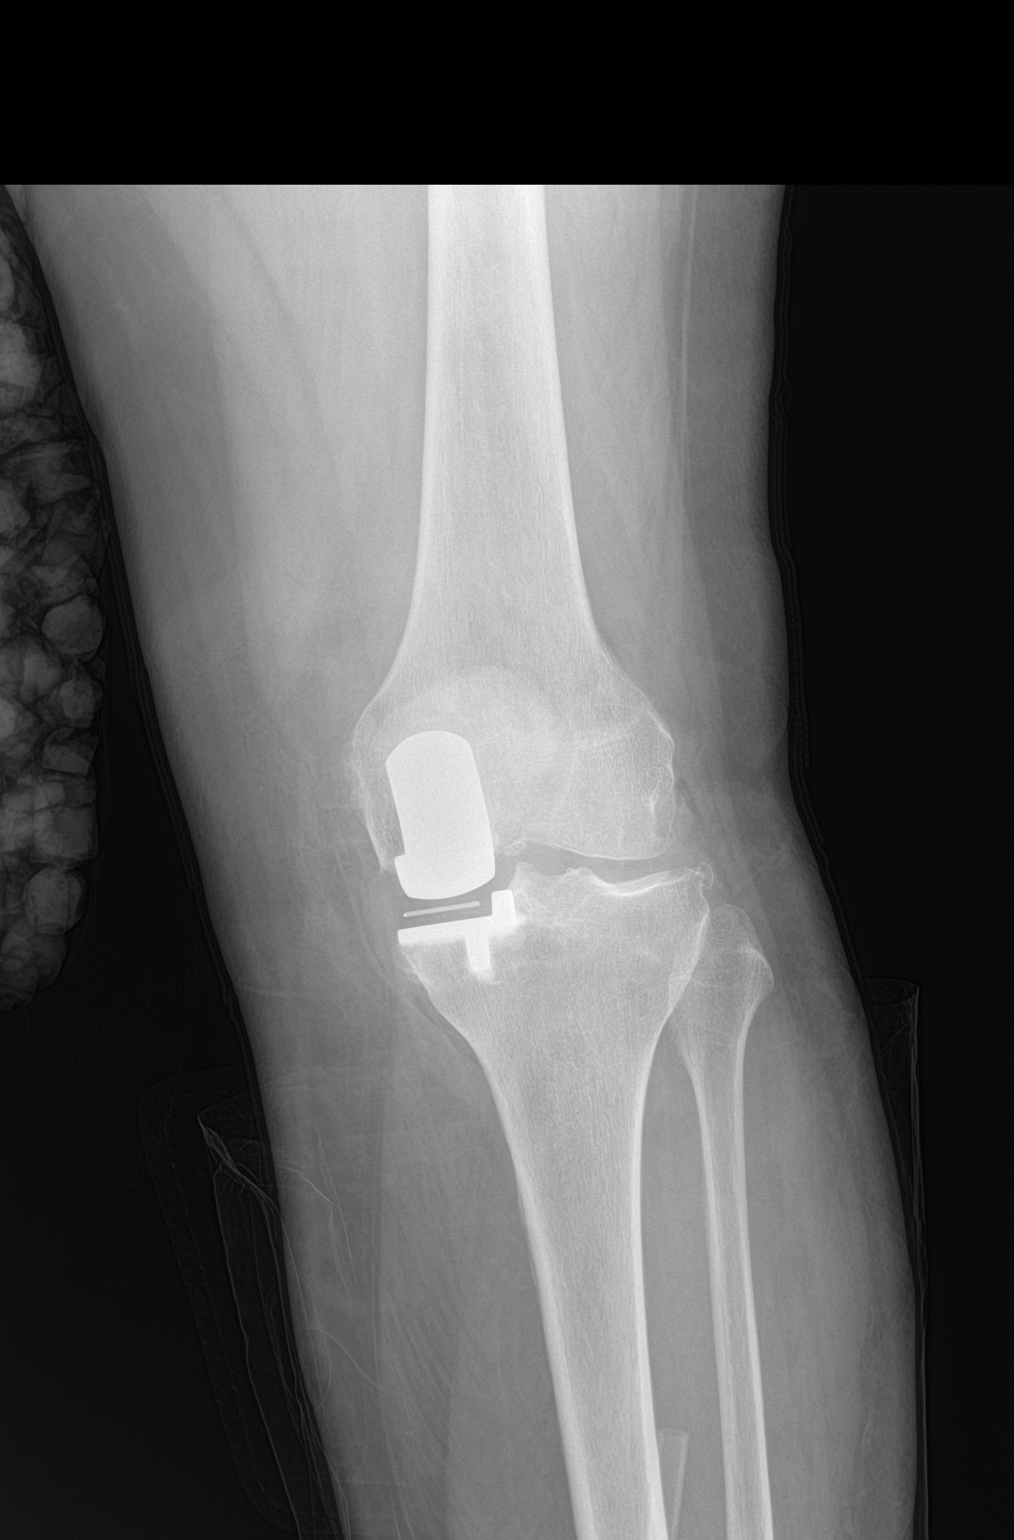
[im 2/2]
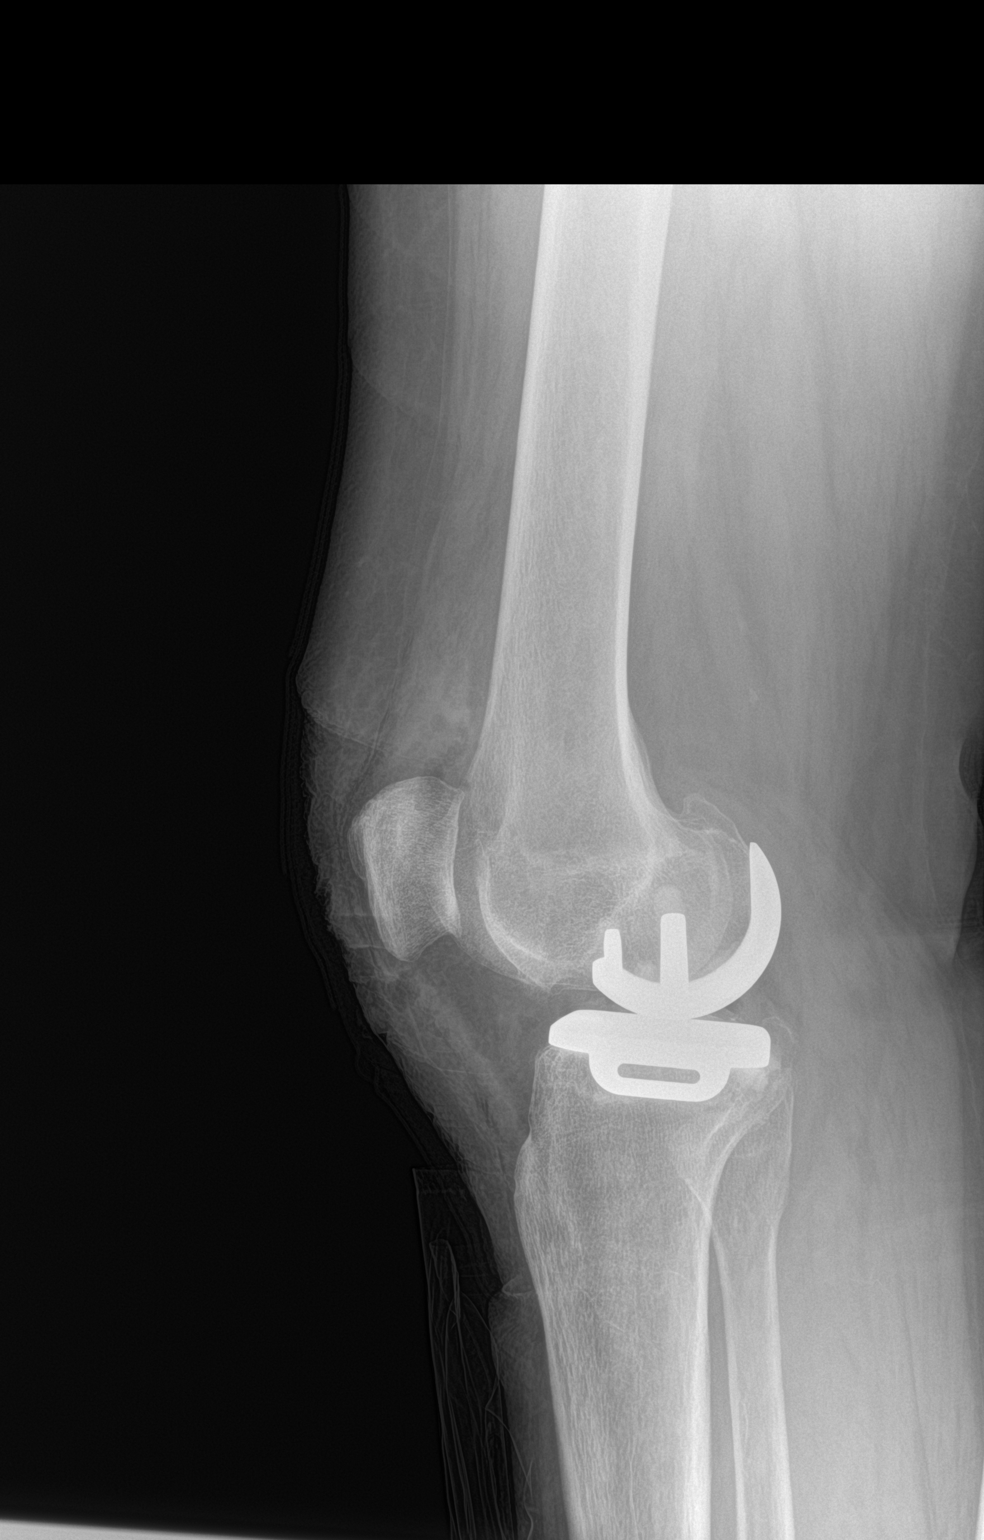

[2 of 2 positions shown; findings below may reference images not displayed]

FINDINGS: The patient has undergone partial knee replacement involving the
medial compartment. Radiographic positioning of the prosthetic
components is good. The lateral and patellofemoral compartments
exhibit mild degenerative changes.
IMPRESSION: No immediate complication following unicompartmental left knee joint
replacement.

## 2020-06-28 DIAGNOSIS — M1712 Unilateral primary osteoarthritis, left knee: Secondary | ICD-10-CM | POA: Diagnosis not present

## 2020-06-28 DIAGNOSIS — I10 Essential (primary) hypertension: Secondary | ICD-10-CM | POA: Diagnosis not present

## 2020-06-28 DIAGNOSIS — F330 Major depressive disorder, recurrent, mild: Secondary | ICD-10-CM | POA: Diagnosis not present

## 2020-06-28 DIAGNOSIS — K219 Gastro-esophageal reflux disease without esophagitis: Secondary | ICD-10-CM | POA: Diagnosis not present

## 2020-06-28 DIAGNOSIS — E1169 Type 2 diabetes mellitus with other specified complication: Secondary | ICD-10-CM | POA: Diagnosis not present

## 2020-06-28 DIAGNOSIS — E785 Hyperlipidemia, unspecified: Secondary | ICD-10-CM | POA: Diagnosis not present

## 2020-09-28 DIAGNOSIS — E785 Hyperlipidemia, unspecified: Secondary | ICD-10-CM | POA: Diagnosis not present

## 2020-09-28 DIAGNOSIS — M1712 Unilateral primary osteoarthritis, left knee: Secondary | ICD-10-CM | POA: Diagnosis not present

## 2020-09-28 DIAGNOSIS — K219 Gastro-esophageal reflux disease without esophagitis: Secondary | ICD-10-CM | POA: Diagnosis not present

## 2020-09-28 DIAGNOSIS — F330 Major depressive disorder, recurrent, mild: Secondary | ICD-10-CM | POA: Diagnosis not present

## 2020-09-28 DIAGNOSIS — E1169 Type 2 diabetes mellitus with other specified complication: Secondary | ICD-10-CM | POA: Diagnosis not present

## 2020-09-28 DIAGNOSIS — I10 Essential (primary) hypertension: Secondary | ICD-10-CM | POA: Diagnosis not present

## 2020-10-05 DIAGNOSIS — G2581 Restless legs syndrome: Secondary | ICD-10-CM | POA: Diagnosis not present

## 2020-10-05 DIAGNOSIS — F419 Anxiety disorder, unspecified: Secondary | ICD-10-CM | POA: Diagnosis not present

## 2020-10-05 DIAGNOSIS — Z1159 Encounter for screening for other viral diseases: Secondary | ICD-10-CM | POA: Diagnosis not present

## 2020-10-05 DIAGNOSIS — I10 Essential (primary) hypertension: Secondary | ICD-10-CM | POA: Diagnosis not present

## 2020-10-05 DIAGNOSIS — Z7984 Long term (current) use of oral hypoglycemic drugs: Secondary | ICD-10-CM | POA: Diagnosis not present

## 2020-10-05 DIAGNOSIS — E785 Hyperlipidemia, unspecified: Secondary | ICD-10-CM | POA: Diagnosis not present

## 2020-10-05 DIAGNOSIS — M8588 Other specified disorders of bone density and structure, other site: Secondary | ICD-10-CM | POA: Diagnosis not present

## 2020-10-05 DIAGNOSIS — F330 Major depressive disorder, recurrent, mild: Secondary | ICD-10-CM | POA: Diagnosis not present

## 2020-10-05 DIAGNOSIS — E1169 Type 2 diabetes mellitus with other specified complication: Secondary | ICD-10-CM | POA: Diagnosis not present

## 2020-10-05 DIAGNOSIS — E559 Vitamin D deficiency, unspecified: Secondary | ICD-10-CM | POA: Diagnosis not present

## 2020-10-21 DIAGNOSIS — Z780 Asymptomatic menopausal state: Secondary | ICD-10-CM | POA: Diagnosis not present

## 2020-10-21 DIAGNOSIS — M8589 Other specified disorders of bone density and structure, multiple sites: Secondary | ICD-10-CM | POA: Diagnosis not present

## 2020-10-21 DIAGNOSIS — Z1231 Encounter for screening mammogram for malignant neoplasm of breast: Secondary | ICD-10-CM | POA: Diagnosis not present

## 2020-11-04 DIAGNOSIS — E785 Hyperlipidemia, unspecified: Secondary | ICD-10-CM | POA: Diagnosis not present

## 2020-11-04 DIAGNOSIS — E1169 Type 2 diabetes mellitus with other specified complication: Secondary | ICD-10-CM | POA: Diagnosis not present

## 2020-11-04 DIAGNOSIS — Z1159 Encounter for screening for other viral diseases: Secondary | ICD-10-CM | POA: Diagnosis not present

## 2020-11-04 DIAGNOSIS — Z7984 Long term (current) use of oral hypoglycemic drugs: Secondary | ICD-10-CM | POA: Diagnosis not present

## 2020-11-04 DIAGNOSIS — E559 Vitamin D deficiency, unspecified: Secondary | ICD-10-CM | POA: Diagnosis not present

## 2020-11-17 DIAGNOSIS — M1611 Unilateral primary osteoarthritis, right hip: Secondary | ICD-10-CM | POA: Diagnosis not present

## 2020-11-26 DIAGNOSIS — I10 Essential (primary) hypertension: Secondary | ICD-10-CM | POA: Diagnosis not present

## 2020-12-08 DIAGNOSIS — I10 Essential (primary) hypertension: Secondary | ICD-10-CM | POA: Diagnosis not present

## 2020-12-08 DIAGNOSIS — E1169 Type 2 diabetes mellitus with other specified complication: Secondary | ICD-10-CM | POA: Diagnosis not present

## 2020-12-08 DIAGNOSIS — E785 Hyperlipidemia, unspecified: Secondary | ICD-10-CM | POA: Diagnosis not present

## 2020-12-08 DIAGNOSIS — F330 Major depressive disorder, recurrent, mild: Secondary | ICD-10-CM | POA: Diagnosis not present

## 2020-12-08 DIAGNOSIS — K219 Gastro-esophageal reflux disease without esophagitis: Secondary | ICD-10-CM | POA: Diagnosis not present

## 2020-12-08 DIAGNOSIS — M1712 Unilateral primary osteoarthritis, left knee: Secondary | ICD-10-CM | POA: Diagnosis not present

## 2020-12-24 DIAGNOSIS — E119 Type 2 diabetes mellitus without complications: Secondary | ICD-10-CM | POA: Diagnosis not present

## 2020-12-24 DIAGNOSIS — H52203 Unspecified astigmatism, bilateral: Secondary | ICD-10-CM | POA: Diagnosis not present

## 2020-12-28 DIAGNOSIS — I10 Essential (primary) hypertension: Secondary | ICD-10-CM | POA: Diagnosis not present

## 2021-01-11 DIAGNOSIS — Z01419 Encounter for gynecological examination (general) (routine) without abnormal findings: Secondary | ICD-10-CM | POA: Diagnosis not present

## 2021-01-11 DIAGNOSIS — Z124 Encounter for screening for malignant neoplasm of cervix: Secondary | ICD-10-CM | POA: Diagnosis not present

## 2021-01-11 DIAGNOSIS — M8580 Other specified disorders of bone density and structure, unspecified site: Secondary | ICD-10-CM | POA: Diagnosis not present

## 2021-02-01 DIAGNOSIS — E1169 Type 2 diabetes mellitus with other specified complication: Secondary | ICD-10-CM | POA: Diagnosis not present

## 2021-02-01 DIAGNOSIS — I10 Essential (primary) hypertension: Secondary | ICD-10-CM | POA: Diagnosis not present

## 2021-02-01 DIAGNOSIS — E559 Vitamin D deficiency, unspecified: Secondary | ICD-10-CM | POA: Diagnosis not present

## 2021-02-01 DIAGNOSIS — Z86010 Personal history of colonic polyps: Secondary | ICD-10-CM | POA: Diagnosis not present

## 2021-02-01 DIAGNOSIS — Z0000 Encounter for general adult medical examination without abnormal findings: Secondary | ICD-10-CM | POA: Diagnosis not present

## 2021-02-01 DIAGNOSIS — Z1159 Encounter for screening for other viral diseases: Secondary | ICD-10-CM | POA: Diagnosis not present

## 2021-02-01 DIAGNOSIS — G2581 Restless legs syndrome: Secondary | ICD-10-CM | POA: Diagnosis not present

## 2021-02-01 DIAGNOSIS — F330 Major depressive disorder, recurrent, mild: Secondary | ICD-10-CM | POA: Diagnosis not present

## 2021-02-01 DIAGNOSIS — E785 Hyperlipidemia, unspecified: Secondary | ICD-10-CM | POA: Diagnosis not present

## 2021-02-07 DIAGNOSIS — I10 Essential (primary) hypertension: Secondary | ICD-10-CM | POA: Diagnosis not present

## 2021-02-07 DIAGNOSIS — E1169 Type 2 diabetes mellitus with other specified complication: Secondary | ICD-10-CM | POA: Diagnosis not present

## 2021-02-07 DIAGNOSIS — E785 Hyperlipidemia, unspecified: Secondary | ICD-10-CM | POA: Diagnosis not present

## 2021-02-07 DIAGNOSIS — M1712 Unilateral primary osteoarthritis, left knee: Secondary | ICD-10-CM | POA: Diagnosis not present

## 2021-02-07 DIAGNOSIS — K219 Gastro-esophageal reflux disease without esophagitis: Secondary | ICD-10-CM | POA: Diagnosis not present

## 2021-02-07 DIAGNOSIS — F330 Major depressive disorder, recurrent, mild: Secondary | ICD-10-CM | POA: Diagnosis not present

## 2021-02-25 DIAGNOSIS — I10 Essential (primary) hypertension: Secondary | ICD-10-CM | POA: Diagnosis not present

## 2021-02-25 IMAGING — DX DG PORTABLE PELVIS
1 series · 1 of 1 positions shown · non-contrast
Comparison: None.

CLINICAL DATA: Status post right total hip arthroplasty.

EXAM:
PORTABLE PELVIS 1-2 VIEWS

[pelvis ap]
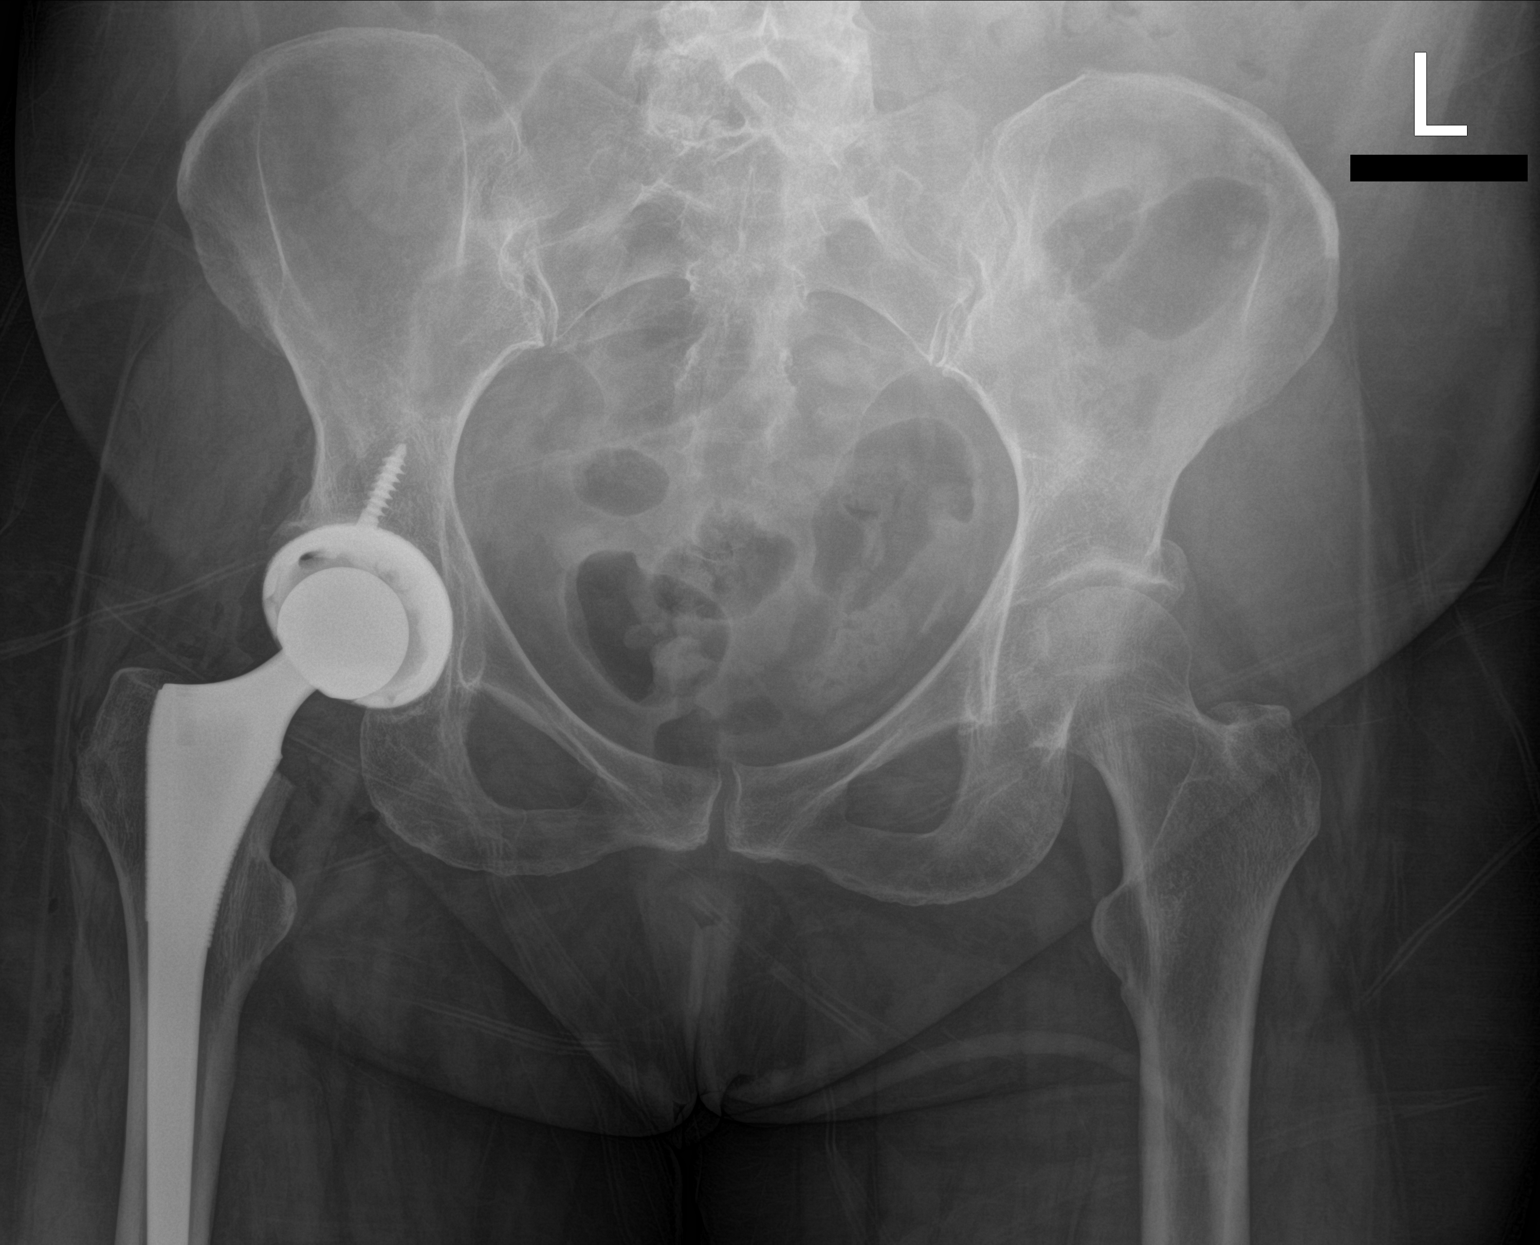

[1 of 1 positions shown; findings below may reference images not displayed]

FINDINGS: Postoperative changes from right total hip arthroplasty identified.
The hardware components are in anatomic alignment. No periprosthetic
fracture or dislocation. Mild degenerative changes are noted in the
left hip.
IMPRESSION: Status post right total hip arthroplasty.

## 2021-03-30 DIAGNOSIS — I10 Essential (primary) hypertension: Secondary | ICD-10-CM | POA: Diagnosis not present

## 2021-04-18 DIAGNOSIS — M1712 Unilateral primary osteoarthritis, left knee: Secondary | ICD-10-CM | POA: Diagnosis not present

## 2021-04-18 DIAGNOSIS — I10 Essential (primary) hypertension: Secondary | ICD-10-CM | POA: Diagnosis not present

## 2021-04-18 DIAGNOSIS — F330 Major depressive disorder, recurrent, mild: Secondary | ICD-10-CM | POA: Diagnosis not present

## 2021-04-18 DIAGNOSIS — E785 Hyperlipidemia, unspecified: Secondary | ICD-10-CM | POA: Diagnosis not present

## 2021-04-18 DIAGNOSIS — K219 Gastro-esophageal reflux disease without esophagitis: Secondary | ICD-10-CM | POA: Diagnosis not present

## 2021-04-18 DIAGNOSIS — E1169 Type 2 diabetes mellitus with other specified complication: Secondary | ICD-10-CM | POA: Diagnosis not present

## 2021-04-29 DIAGNOSIS — I10 Essential (primary) hypertension: Secondary | ICD-10-CM | POA: Diagnosis not present

## 2021-06-22 DIAGNOSIS — E1169 Type 2 diabetes mellitus with other specified complication: Secondary | ICD-10-CM | POA: Diagnosis not present

## 2021-06-22 DIAGNOSIS — I10 Essential (primary) hypertension: Secondary | ICD-10-CM | POA: Diagnosis not present

## 2021-07-19 DIAGNOSIS — D123 Benign neoplasm of transverse colon: Secondary | ICD-10-CM | POA: Diagnosis not present

## 2021-07-19 DIAGNOSIS — Z86010 Personal history of colonic polyps: Secondary | ICD-10-CM | POA: Diagnosis not present

## 2021-07-21 DIAGNOSIS — D123 Benign neoplasm of transverse colon: Secondary | ICD-10-CM | POA: Diagnosis not present

## 2021-08-24 DIAGNOSIS — E559 Vitamin D deficiency, unspecified: Secondary | ICD-10-CM | POA: Diagnosis not present

## 2021-08-24 DIAGNOSIS — I10 Essential (primary) hypertension: Secondary | ICD-10-CM | POA: Diagnosis not present

## 2021-08-24 DIAGNOSIS — K219 Gastro-esophageal reflux disease without esophagitis: Secondary | ICD-10-CM | POA: Diagnosis not present

## 2021-08-24 DIAGNOSIS — F419 Anxiety disorder, unspecified: Secondary | ICD-10-CM | POA: Diagnosis not present

## 2021-08-24 DIAGNOSIS — F330 Major depressive disorder, recurrent, mild: Secondary | ICD-10-CM | POA: Diagnosis not present

## 2021-08-24 DIAGNOSIS — E785 Hyperlipidemia, unspecified: Secondary | ICD-10-CM | POA: Diagnosis not present

## 2021-08-24 DIAGNOSIS — E1169 Type 2 diabetes mellitus with other specified complication: Secondary | ICD-10-CM | POA: Diagnosis not present

## 2021-10-27 DIAGNOSIS — Z1231 Encounter for screening mammogram for malignant neoplasm of breast: Secondary | ICD-10-CM | POA: Diagnosis not present

## 2022-01-20 DIAGNOSIS — Z9189 Other specified personal risk factors, not elsewhere classified: Secondary | ICD-10-CM | POA: Diagnosis not present

## 2022-01-20 DIAGNOSIS — Z8742 Personal history of other diseases of the female genital tract: Secondary | ICD-10-CM | POA: Diagnosis not present

## 2022-02-10 DIAGNOSIS — E538 Deficiency of other specified B group vitamins: Secondary | ICD-10-CM | POA: Diagnosis not present

## 2022-02-10 DIAGNOSIS — F419 Anxiety disorder, unspecified: Secondary | ICD-10-CM | POA: Diagnosis not present

## 2022-02-10 DIAGNOSIS — E559 Vitamin D deficiency, unspecified: Secondary | ICD-10-CM | POA: Diagnosis not present

## 2022-02-10 DIAGNOSIS — K219 Gastro-esophageal reflux disease without esophagitis: Secondary | ICD-10-CM | POA: Diagnosis not present

## 2022-02-10 DIAGNOSIS — I10 Essential (primary) hypertension: Secondary | ICD-10-CM | POA: Diagnosis not present

## 2022-02-10 DIAGNOSIS — Z23 Encounter for immunization: Secondary | ICD-10-CM | POA: Diagnosis not present

## 2022-02-10 DIAGNOSIS — E1169 Type 2 diabetes mellitus with other specified complication: Secondary | ICD-10-CM | POA: Diagnosis not present

## 2022-02-10 DIAGNOSIS — F330 Major depressive disorder, recurrent, mild: Secondary | ICD-10-CM | POA: Diagnosis not present

## 2022-02-10 DIAGNOSIS — Z0000 Encounter for general adult medical examination without abnormal findings: Secondary | ICD-10-CM | POA: Diagnosis not present

## 2022-02-10 DIAGNOSIS — Z79899 Other long term (current) drug therapy: Secondary | ICD-10-CM | POA: Diagnosis not present

## 2022-02-10 DIAGNOSIS — E785 Hyperlipidemia, unspecified: Secondary | ICD-10-CM | POA: Diagnosis not present

## 2022-02-13 ENCOUNTER — Other Ambulatory Visit: Payer: Self-pay | Admitting: Family Medicine

## 2022-02-13 DIAGNOSIS — R0989 Other specified symptoms and signs involving the circulatory and respiratory systems: Secondary | ICD-10-CM

## 2022-02-16 ENCOUNTER — Ambulatory Visit
Admission: RE | Admit: 2022-02-16 | Discharge: 2022-02-16 | Disposition: A | Discharge status: Home / Self Care | Payer: Medicare HMO | Source: Ambulatory Visit | Attending: Family Medicine | Admitting: Family Medicine

## 2022-02-16 DIAGNOSIS — R0989 Other specified symptoms and signs involving the circulatory and respiratory systems: Secondary | ICD-10-CM

## 2022-02-16 DIAGNOSIS — I998 Other disorder of circulatory system: Secondary | ICD-10-CM | POA: Diagnosis not present

## 2022-08-11 DIAGNOSIS — K580 Irritable bowel syndrome with diarrhea: Secondary | ICD-10-CM | POA: Diagnosis not present

## 2022-08-11 DIAGNOSIS — G2581 Restless legs syndrome: Secondary | ICD-10-CM | POA: Diagnosis not present

## 2022-08-11 DIAGNOSIS — K529 Noninfective gastroenteritis and colitis, unspecified: Secondary | ICD-10-CM | POA: Diagnosis not present

## 2022-08-11 DIAGNOSIS — E1169 Type 2 diabetes mellitus with other specified complication: Secondary | ICD-10-CM | POA: Diagnosis not present

## 2022-08-11 DIAGNOSIS — E119 Type 2 diabetes mellitus without complications: Secondary | ICD-10-CM | POA: Diagnosis not present

## 2022-08-11 DIAGNOSIS — F330 Major depressive disorder, recurrent, mild: Secondary | ICD-10-CM | POA: Diagnosis not present

## 2022-08-11 DIAGNOSIS — I10 Essential (primary) hypertension: Secondary | ICD-10-CM | POA: Diagnosis not present

## 2022-08-11 DIAGNOSIS — E785 Hyperlipidemia, unspecified: Secondary | ICD-10-CM | POA: Diagnosis not present

## 2022-08-11 DIAGNOSIS — F419 Anxiety disorder, unspecified: Secondary | ICD-10-CM | POA: Diagnosis not present

## 2022-09-19 DIAGNOSIS — H524 Presbyopia: Secondary | ICD-10-CM | POA: Diagnosis not present

## 2022-09-19 DIAGNOSIS — E119 Type 2 diabetes mellitus without complications: Secondary | ICD-10-CM | POA: Diagnosis not present

## 2022-09-19 DIAGNOSIS — H52203 Unspecified astigmatism, bilateral: Secondary | ICD-10-CM | POA: Diagnosis not present

## 2022-10-02 DIAGNOSIS — K580 Irritable bowel syndrome with diarrhea: Secondary | ICD-10-CM | POA: Diagnosis not present

## 2022-10-27 ENCOUNTER — Other Ambulatory Visit: Payer: Self-pay | Admitting: Physician Assistant

## 2022-10-27 DIAGNOSIS — R1031 Right lower quadrant pain: Secondary | ICD-10-CM | POA: Diagnosis not present

## 2022-10-27 DIAGNOSIS — G8929 Other chronic pain: Secondary | ICD-10-CM

## 2022-10-27 DIAGNOSIS — K589 Irritable bowel syndrome without diarrhea: Secondary | ICD-10-CM | POA: Diagnosis not present

## 2022-10-27 DIAGNOSIS — R350 Frequency of micturition: Secondary | ICD-10-CM | POA: Diagnosis not present

## 2022-11-03 DIAGNOSIS — Z1231 Encounter for screening mammogram for malignant neoplasm of breast: Secondary | ICD-10-CM | POA: Diagnosis not present

## 2022-11-13 DIAGNOSIS — R1031 Right lower quadrant pain: Secondary | ICD-10-CM | POA: Diagnosis not present

## 2022-11-22 ENCOUNTER — Ambulatory Visit
Admission: RE | Admit: 2022-11-22 | Discharge: 2022-11-22 | Disposition: A | Discharge status: Home / Self Care | Payer: Medicare HMO | Source: Ambulatory Visit | Attending: Physician Assistant | Admitting: Physician Assistant

## 2022-11-22 DIAGNOSIS — K8020 Calculus of gallbladder without cholecystitis without obstruction: Secondary | ICD-10-CM | POA: Diagnosis not present

## 2022-11-22 DIAGNOSIS — G8929 Other chronic pain: Secondary | ICD-10-CM

## 2022-11-22 DIAGNOSIS — R1031 Right lower quadrant pain: Secondary | ICD-10-CM | POA: Diagnosis not present

## 2022-11-22 DIAGNOSIS — I700 Atherosclerosis of aorta: Secondary | ICD-10-CM | POA: Diagnosis not present

## 2022-11-22 DIAGNOSIS — K769 Liver disease, unspecified: Secondary | ICD-10-CM | POA: Diagnosis not present

## 2022-11-22 MED ORDER — IOPAMIDOL (ISOVUE-300) INJECTION 61%
100.0000 mL | Freq: Once | INTRAVENOUS | Status: AC | PRN
  Administered 2022-11-22: 100 mL via INTRAVENOUS

## 2022-12-01 ENCOUNTER — Other Ambulatory Visit: Payer: Self-pay | Admitting: Physician Assistant

## 2022-12-01 DIAGNOSIS — K769 Liver disease, unspecified: Secondary | ICD-10-CM

## 2022-12-07 ENCOUNTER — Ambulatory Visit
Admission: RE | Admit: 2022-12-07 | Discharge: 2022-12-07 | Disposition: A | Discharge status: Home / Self Care | Payer: Medicare HMO | Source: Ambulatory Visit | Attending: Physician Assistant | Admitting: Physician Assistant

## 2022-12-07 DIAGNOSIS — K769 Liver disease, unspecified: Secondary | ICD-10-CM | POA: Diagnosis not present

## 2022-12-18 ENCOUNTER — Other Ambulatory Visit: Payer: Self-pay | Admitting: Family Medicine

## 2022-12-18 DIAGNOSIS — K769 Liver disease, unspecified: Secondary | ICD-10-CM

## 2022-12-24 ENCOUNTER — Ambulatory Visit
Admission: RE | Admit: 2022-12-24 | Discharge: 2022-12-24 | Disposition: A | Discharge status: Home / Self Care | Payer: Medicare HMO | Source: Ambulatory Visit | Attending: Family Medicine | Admitting: Family Medicine

## 2022-12-24 DIAGNOSIS — K7689 Other specified diseases of liver: Secondary | ICD-10-CM | POA: Diagnosis not present

## 2022-12-24 DIAGNOSIS — R1031 Right lower quadrant pain: Secondary | ICD-10-CM | POA: Diagnosis not present

## 2022-12-24 DIAGNOSIS — K769 Liver disease, unspecified: Secondary | ICD-10-CM

## 2022-12-24 MED ORDER — GADOPICLENOL 0.5 MMOL/ML IV SOLN
10.0000 mL | Freq: Once | INTRAVENOUS | Status: AC | PRN
  Administered 2022-12-24: 10 mL via INTRAVENOUS

## 2022-12-29 ENCOUNTER — Other Ambulatory Visit: Payer: Medicare HMO

## 2023-01-26 DIAGNOSIS — N8110 Cystocele, unspecified: Secondary | ICD-10-CM | POA: Diagnosis not present

## 2023-01-26 DIAGNOSIS — Z9189 Other specified personal risk factors, not elsewhere classified: Secondary | ICD-10-CM | POA: Diagnosis not present

## 2023-01-26 DIAGNOSIS — Z8742 Personal history of other diseases of the female genital tract: Secondary | ICD-10-CM | POA: Diagnosis not present

## 2023-01-26 DIAGNOSIS — N838 Other noninflammatory disorders of ovary, fallopian tube and broad ligament: Secondary | ICD-10-CM | POA: Diagnosis not present

## 2023-01-26 DIAGNOSIS — R102 Pelvic and perineal pain: Secondary | ICD-10-CM | POA: Diagnosis not present

## 2023-02-06 DIAGNOSIS — N839 Noninflammatory disorder of ovary, fallopian tube and broad ligament, unspecified: Secondary | ICD-10-CM | POA: Diagnosis not present

## 2023-02-06 DIAGNOSIS — R102 Pelvic and perineal pain: Secondary | ICD-10-CM | POA: Diagnosis not present

## 2023-02-28 DIAGNOSIS — Z79899 Other long term (current) drug therapy: Secondary | ICD-10-CM | POA: Diagnosis not present

## 2023-02-28 DIAGNOSIS — E559 Vitamin D deficiency, unspecified: Secondary | ICD-10-CM | POA: Diagnosis not present

## 2023-02-28 DIAGNOSIS — Z0000 Encounter for general adult medical examination without abnormal findings: Secondary | ICD-10-CM | POA: Diagnosis not present

## 2023-02-28 DIAGNOSIS — E119 Type 2 diabetes mellitus without complications: Secondary | ICD-10-CM | POA: Diagnosis not present

## 2023-02-28 DIAGNOSIS — F330 Major depressive disorder, recurrent, mild: Secondary | ICD-10-CM | POA: Diagnosis not present

## 2023-02-28 DIAGNOSIS — E785 Hyperlipidemia, unspecified: Secondary | ICD-10-CM | POA: Diagnosis not present

## 2023-02-28 DIAGNOSIS — Z23 Encounter for immunization: Secondary | ICD-10-CM | POA: Diagnosis not present

## 2023-02-28 DIAGNOSIS — I10 Essential (primary) hypertension: Secondary | ICD-10-CM | POA: Diagnosis not present

## 2023-02-28 DIAGNOSIS — F419 Anxiety disorder, unspecified: Secondary | ICD-10-CM | POA: Diagnosis not present

## 2023-02-28 DIAGNOSIS — G2581 Restless legs syndrome: Secondary | ICD-10-CM | POA: Diagnosis not present

## 2023-02-28 DIAGNOSIS — E1169 Type 2 diabetes mellitus with other specified complication: Secondary | ICD-10-CM | POA: Diagnosis not present

## 2023-03-05 DIAGNOSIS — M8588 Other specified disorders of bone density and structure, other site: Secondary | ICD-10-CM | POA: Diagnosis not present

## 2023-03-06 ENCOUNTER — Ambulatory Visit: Payer: Self-pay | Admitting: General Surgery

## 2023-03-06 ENCOUNTER — Other Ambulatory Visit: Payer: Self-pay | Admitting: Family Medicine

## 2023-03-06 DIAGNOSIS — K8020 Calculus of gallbladder without cholecystitis without obstruction: Secondary | ICD-10-CM | POA: Diagnosis not present

## 2023-03-06 DIAGNOSIS — R591 Generalized enlarged lymph nodes: Secondary | ICD-10-CM

## 2023-03-19 ENCOUNTER — Ambulatory Visit
Admission: RE | Admit: 2023-03-19 | Discharge: 2023-03-19 | Disposition: A | Discharge status: Home / Self Care | Payer: Medicare HMO | Source: Ambulatory Visit | Attending: Family Medicine | Admitting: Family Medicine

## 2023-03-19 DIAGNOSIS — R591 Generalized enlarged lymph nodes: Secondary | ICD-10-CM

## 2023-03-19 DIAGNOSIS — R599 Enlarged lymph nodes, unspecified: Secondary | ICD-10-CM | POA: Diagnosis not present

## 2023-03-19 DIAGNOSIS — R161 Splenomegaly, not elsewhere classified: Secondary | ICD-10-CM | POA: Diagnosis not present

## 2023-03-19 DIAGNOSIS — D1803 Hemangioma of intra-abdominal structures: Secondary | ICD-10-CM | POA: Diagnosis not present

## 2023-03-19 DIAGNOSIS — K8020 Calculus of gallbladder without cholecystitis without obstruction: Secondary | ICD-10-CM | POA: Diagnosis not present

## 2023-03-19 MED ORDER — IOPAMIDOL (ISOVUE-300) INJECTION 61%
100.0000 mL | Freq: Once | INTRAVENOUS | Status: AC | PRN
  Administered 2023-03-19: 100 mL via INTRAVENOUS

## 2023-03-28 ENCOUNTER — Encounter (HOSPITAL_COMMUNITY): Payer: Self-pay

## 2023-03-28 NOTE — Pre-Procedure Instructions (Signed)
Surgical Instructions   Your procedure is scheduled on Thursday, December 5th. Report to [facility] Main Entrance "A" at 08:30 A.M., then check in with the Admitting office. Any questions or running late day of surgery: call [phone]  Questions prior to your surgery date: call [phone], Monday-Friday, 8am-4pm. If you experience any cold or flu symptoms such as cough, fever, chills, shortness of breath, etc. between now and your scheduled surgery, please notify us at the above number.     Remember:  Do not eat after midnight the night before your surgery   You may drink clear liquids until 07:30 AM the morning of your surgery.   Clear liquids allowed are: Water, Non-Citrus Juices (without pulp), Carbonated Beverages, Clear Tea (no milk, honey, etc.), Black Coffee Only (NO MILK, CREAM OR POWDERED CREAMER of any kind), and Gatorade.    Take these medicines the morning of surgery with A SIP OF WATER  FLUoxetine (PROZAC)  omeprazole (PRILOSEC)  rosuvastatin (CRESTOR)      One week prior to surgery, STOP taking any Aspirin (unless otherwise instructed by your surgeon) Aleve, Naproxen, Ibuprofen, Motrin, Advil, Goody's, BC's, all herbal medications, fish oil, and non-prescription vitamins.                     Do NOT Smoke (Tobacco/Vaping) for 24 hours prior to your procedure.  If you use a CPAP at night, you may bring your mask/headgear for your overnight stay.   You will be asked to remove any contacts, glasses, piercing's, hearing aid's, dentures/partials prior to surgery. Please bring cases for these items if needed.    Patients discharged the day of surgery will not be allowed to drive home, and someone needs to stay with them for 24 hours.  SURGICAL WAITING ROOM VISITATION Patients may have no more than 2 support people in the waiting area - these visitors may rotate.   Pre-op nurse will coordinate an appropriate time for 1 ADULT support person, who may not rotate, to  accompany patient in pre-op.  Children under the age of 33 must have an adult with them who is not the patient and must remain in the main waiting area with an adult.  If the patient needs to stay at the hospital during part of their recovery, the visitor guidelines for inpatient rooms apply.  Please refer to the [facility] website for the visitor guidelines for any additional information.   If you received a COVID test during your pre-op visit  it is requested that you wear a mask when out in public, stay away from anyone that may not be feeling well and notify your surgeon if you develop symptoms. If you have been in contact with anyone that has tested positive in the last 10 days please notify you surgeon.      Pre-operative CHG Bathing Instructions   You can play a key role in reducing the risk of infection after surgery. Your skin needs to be as free of germs as possible. You can reduce the number of germs on your skin by washing with CHG (chlorhexidine gluconate) soap before surgery. CHG is an antiseptic soap that kills germs and continues to kill germs even after washing.   DO NOT use if you have an allergy to chlorhexidine/CHG or antibacterial soaps. If your skin becomes reddened or irritated, stop using the CHG and notify one of our RNs at [phone].              TAKE  A SHOWER THE NIGHT BEFORE SURGERY AND THE DAY OF SURGERY    Please keep in mind the following:  DO NOT shave, including legs and underarms, 48 hours prior to surgery.   You may shave your face before/day of surgery.  Place clean sheets on your bed the night before surgery Use a clean washcloth (not used since being washed) for each shower. DO NOT sleep with pet's night before surgery.  CHG Shower Instructions:  Wash your face and private area with normal soap. If you choose to wash your hair, wash first with your normal shampoo.  After you use shampoo/soap, rinse your hair and body thoroughly to remove  shampoo/soap residue.  Turn the water OFF and apply half the bottle of CHG soap to a CLEAN washcloth.  Apply CHG soap ONLY FROM YOUR NECK DOWN TO YOUR TOES (washing for 3-5 minutes)  DO NOT use CHG soap on face, private areas, open wounds, or sores.  Pay special attention to the area where your surgery is being performed.  If you are having back surgery, having someone wash your back for you may be helpful. Wait 2 minutes after CHG soap is applied, then you may rinse off the CHG soap.  Pat dry with a clean towel  Put on clean pajamas    Additional instructions for the day of surgery: DO NOT APPLY any lotions, deodorants, cologne, or perfumes.   Do not wear jewelry or makeup Do not wear nail polish, gel polish, artificial nails, or any other type of covering on natural nails (fingers and toes) Do not bring valuables to the hospital. [facility] is not responsible for valuables/personal belongings. Put on clean/comfortable clothes.  Please brush your teeth.  Ask your nurse before applying any prescription medications to the skin.

## 2023-04-02 ENCOUNTER — Encounter (HOSPITAL_COMMUNITY): Payer: Self-pay

## 2023-04-02 ENCOUNTER — Other Ambulatory Visit: Payer: Self-pay

## 2023-04-02 ENCOUNTER — Encounter (HOSPITAL_COMMUNITY)
Admission: RE | Admit: 2023-04-02 | Discharge: 2023-04-02 | Disposition: A | Discharge status: Home / Self Care | Payer: Medicare HMO | Source: Ambulatory Visit | Attending: General Surgery | Admitting: General Surgery

## 2023-04-02 VITALS — BP 150/68 | HR 77 | Temp 98.5°F | Resp 17 | Ht 66.5 in | Wt 235.5 lb

## 2023-04-02 DIAGNOSIS — K8020 Calculus of gallbladder without cholecystitis without obstruction: Secondary | ICD-10-CM | POA: Diagnosis not present

## 2023-04-02 DIAGNOSIS — Z01818 Encounter for other preprocedural examination: Secondary | ICD-10-CM | POA: Insufficient documentation

## 2023-04-02 DIAGNOSIS — E119 Type 2 diabetes mellitus without complications: Secondary | ICD-10-CM | POA: Insufficient documentation

## 2023-04-02 HISTORY — DX: Personal history of other diseases of the digestive system: Z87.19

## 2023-04-02 LAB — CBC
HCT: 37.2 % (ref 36.0–46.0)
Hemoglobin: 12 g/dL (ref 12.0–15.0)
MCH: 26.8 pg (ref 26.0–34.0)
MCHC: 32.3 g/dL (ref 30.0–36.0)
MCV: 83 fL (ref 80.0–100.0)
Platelets: 197 10*3/uL (ref 150–400)
RBC: 4.48 MIL/uL (ref 3.87–5.11)
RDW: 14.4 % (ref 11.5–15.5)
WBC: 6.8 10*3/uL (ref 4.0–10.5)
nRBC: 0 % (ref 0.0–0.2)

## 2023-04-02 LAB — COMPREHENSIVE METABOLIC PANEL
ALT: 43 U/L (ref 0–44)
AST: 53 U/L — ABNORMAL HIGH (ref 15–41)
Albumin: 3.8 g/dL (ref 3.5–5.0)
Alkaline Phosphatase: 67 U/L (ref 38–126)
Anion gap: 7 (ref 5–15)
BUN: 11 mg/dL (ref 8–23)
CO2: 25 mmol/L (ref 22–32)
Calcium: 9.2 mg/dL (ref 8.9–10.3)
Chloride: 105 mmol/L (ref 98–111)
Creatinine, Ser: 0.95 mg/dL (ref 0.44–1.00)
GFR, Estimated: >60 mL/min (ref >60)
Glucose, Bld: 144 mg/dL — ABNORMAL HIGH (ref 70–99)
Potassium: 3.9 mmol/L (ref 3.5–5.1)
Sodium: 137 mmol/L (ref 135–145)
Total Bilirubin: 0.6 mg/dL (ref <1.2)
Total Protein: 7.5 g/dL (ref 6.5–8.1)

## 2023-04-02 LAB — GLUCOSE, CAPILLARY: Glucose-Capillary: 174 mg/dL — ABNORMAL HIGH (ref 70–99)

## 2023-04-02 NOTE — Progress Notes (Signed)
PCP - Dr. Laurann Montana Cardiologist - denies  PPM/ICD - denies    Chest x-ray - denies EKG - 04/02/23 Stress Test - denies ECHO - denies Cardiac Cath - denies  Sleep Study - denies   DM- Pt states she used to take metformin but no longer has to take DM meds. She does not check CBG at home and does not know typical fasting levels  Last dose of GLP1 agonist-  n/a   ASA/Blood Thinner Instructions: n/a   ERAS Protcol - yes, no drink   COVID TEST- n/a   Anesthesia review: no  Patient denies shortness of breath, fever, cough and chest pain at PAT appointment   All instructions explained to the patient, with a verbal understanding of the material. Patient agrees to go over the instructions while at home for a better understanding.  The opportunity to ask questions was provided.

## 2023-04-05 ENCOUNTER — Other Ambulatory Visit: Payer: Self-pay

## 2023-04-05 ENCOUNTER — Encounter (HOSPITAL_COMMUNITY)
Admission: RE | Disposition: A | Discharge status: Home / Self Care | Payer: Self-pay | Source: Ambulatory Visit | Attending: General Surgery

## 2023-04-05 ENCOUNTER — Ambulatory Visit (HOSPITAL_COMMUNITY)
Admission: RE | Admit: 2023-04-05 | Discharge: 2023-04-05 | Disposition: A | Discharge status: Home / Self Care | Payer: Medicare HMO | Source: Ambulatory Visit | Attending: General Surgery | Admitting: General Surgery

## 2023-04-05 ENCOUNTER — Ambulatory Visit (HOSPITAL_COMMUNITY): Payer: Medicare HMO | Admitting: Anesthesiology

## 2023-04-05 ENCOUNTER — Encounter (HOSPITAL_COMMUNITY): Payer: Self-pay | Admitting: General Surgery

## 2023-04-05 ENCOUNTER — Ambulatory Visit (HOSPITAL_COMMUNITY): Payer: Medicare HMO

## 2023-04-05 DIAGNOSIS — K8010 Calculus of gallbladder with chronic cholecystitis without obstruction: Secondary | ICD-10-CM | POA: Insufficient documentation

## 2023-04-05 DIAGNOSIS — I10 Essential (primary) hypertension: Secondary | ICD-10-CM | POA: Insufficient documentation

## 2023-04-05 DIAGNOSIS — K219 Gastro-esophageal reflux disease without esophagitis: Secondary | ICD-10-CM | POA: Insufficient documentation

## 2023-04-05 DIAGNOSIS — K8020 Calculus of gallbladder without cholecystitis without obstruction: Secondary | ICD-10-CM | POA: Diagnosis not present

## 2023-04-05 DIAGNOSIS — Z87891 Personal history of nicotine dependence: Secondary | ICD-10-CM | POA: Diagnosis not present

## 2023-04-05 DIAGNOSIS — E119 Type 2 diabetes mellitus without complications: Secondary | ICD-10-CM | POA: Insufficient documentation

## 2023-04-05 HISTORY — PX: CHOLECYSTECTOMY: SHX55

## 2023-04-05 LAB — GLUCOSE, CAPILLARY
Glucose-Capillary: 129 mg/dL — ABNORMAL HIGH (ref 70–99)
Glucose-Capillary: 164 mg/dL — ABNORMAL HIGH (ref 70–99)

## 2023-04-05 SURGERY — LAPAROSCOPIC CHOLECYSTECTOMY WITH INTRAOPERATIVE CHOLANGIOGRAM
Anesthesia: General | Site: Abdomen

## 2023-04-05 MED ORDER — FENTANYL CITRATE (PF) 100 MCG/2ML IJ SOLN
INTRAMUSCULAR | Status: AC
  Filled 2023-04-05: qty 2

## 2023-04-05 MED ORDER — MIDAZOLAM HCL 2 MG/2ML IJ SOLN
INTRAMUSCULAR | Status: AC
  Filled 2023-04-05: qty 2

## 2023-04-05 MED ORDER — ACETAMINOPHEN 325 MG PO TABS: 325.0000 mg | ORAL_TABLET | ORAL | Status: DC | PRN

## 2023-04-05 MED ORDER — GABAPENTIN 100 MG PO CAPS: 100.0000 mg | ORAL_CAPSULE | ORAL | Status: AC

## 2023-04-05 MED ORDER — ACETAMINOPHEN 500 MG PO TABS
1000.0000 mg | ORAL_TABLET | ORAL | Status: AC
Start: 2023-04-05 — End: 2023-04-05

## 2023-04-05 MED ORDER — CEFAZOLIN SODIUM-DEXTROSE 2-4 GM/100ML-% IV SOLN
INTRAVENOUS | Status: AC
  Filled 2023-04-05: qty 100

## 2023-04-05 MED ORDER — ROCURONIUM BROMIDE 10 MG/ML (PF) SYRINGE
PREFILLED_SYRINGE | INTRAVENOUS | Status: DC | PRN
  Administered 2023-04-05: 60 mg via INTRAVENOUS

## 2023-04-05 MED ORDER — ONDANSETRON HCL 4 MG/2ML IJ SOLN
INTRAMUSCULAR | Status: DC | PRN
  Administered 2023-04-05: 4 mg via INTRAVENOUS

## 2023-04-05 MED ORDER — MEPERIDINE HCL 25 MG/ML IJ SOLN
6.2500 mg | INTRAMUSCULAR | Status: DC | PRN
Start: 2023-04-05 — End: 2023-04-07

## 2023-04-05 MED ORDER — KETOROLAC TROMETHAMINE 15 MG/ML IJ SOLN
15.0000 mg | Freq: Once | INTRAMUSCULAR | Status: AC
  Administered 2023-04-05: 15 mg via INTRAVENOUS

## 2023-04-05 MED ORDER — CHLORHEXIDINE GLUCONATE CLOTH 2 % EX PADS: 6.0000 | MEDICATED_PAD | Freq: Once | CUTANEOUS | Status: DC

## 2023-04-05 MED ORDER — OXYCODONE HCL 5 MG PO TABS
5.0000 mg | ORAL_TABLET | Freq: Once | ORAL | Status: AC | PRN
  Administered 2023-04-05: 5 mg via ORAL

## 2023-04-05 MED ORDER — SODIUM CHLORIDE 0.9 % IR SOLN
Status: DC | PRN
  Administered 2023-04-05: 1000 mL

## 2023-04-05 MED ORDER — OXYCODONE HCL 5 MG PO TABS
ORAL_TABLET | ORAL | Status: AC
  Filled 2023-04-05: qty 1

## 2023-04-05 MED ORDER — MIDAZOLAM HCL 2 MG/2ML IJ SOLN
INTRAMUSCULAR | Status: DC | PRN
  Administered 2023-04-05: 2 mg via INTRAVENOUS

## 2023-04-05 MED ORDER — BUPIVACAINE-EPINEPHRINE 0.25% -1:200000 IJ SOLN
INTRAMUSCULAR | Status: DC | PRN
  Administered 2023-04-05: 10 mL

## 2023-04-05 MED ORDER — KETOROLAC TROMETHAMINE 15 MG/ML IJ SOLN
INTRAMUSCULAR | Status: AC
  Filled 2023-04-05: qty 1

## 2023-04-05 MED ORDER — PROPOFOL 10 MG/ML IV BOLUS
INTRAVENOUS | Status: DC | PRN
  Administered 2023-04-05: 200 mg via INTRAVENOUS

## 2023-04-05 MED ORDER — GABAPENTIN 100 MG PO CAPS
ORAL_CAPSULE | ORAL | Status: AC
  Administered 2023-04-05: 100 mg via ORAL
  Filled 2023-04-05: qty 1

## 2023-04-05 MED ORDER — SUGAMMADEX SODIUM 200 MG/2ML IV SOLN
INTRAVENOUS | Status: DC | PRN
  Administered 2023-04-05: 200 mg via INTRAVENOUS

## 2023-04-05 MED ORDER — FENTANYL CITRATE (PF) 250 MCG/5ML IJ SOLN
INTRAMUSCULAR | Status: DC | PRN
  Administered 2023-04-05: 50 ug via INTRAVENOUS
  Administered 2023-04-05: 100 ug via INTRAVENOUS
  Administered 2023-04-05 (×2): 50 ug via INTRAVENOUS

## 2023-04-05 MED ORDER — INSULIN ASPART 100 UNIT/ML IJ SOLN: 0.0000 [IU] | INTRAMUSCULAR | Status: DC | PRN

## 2023-04-05 MED ORDER — ACETAMINOPHEN 160 MG/5ML PO SOLN: 325.0000 mg | ORAL | Status: DC | PRN

## 2023-04-05 MED ORDER — OXYCODONE HCL 5 MG PO TABS: 5.0000 mg | ORAL_TABLET | Freq: Four times a day (QID) | ORAL | 0 refills | Status: DC | PRN

## 2023-04-05 MED ORDER — DEXAMETHASONE SODIUM PHOSPHATE 10 MG/ML IJ SOLN
INTRAMUSCULAR | Status: DC | PRN
  Administered 2023-04-05: 5 mg via INTRAVENOUS

## 2023-04-05 MED ORDER — CHLORHEXIDINE GLUCONATE 0.12 % MT SOLN
OROMUCOSAL | Status: AC
  Administered 2023-04-05: 15 mL via OROMUCOSAL
  Filled 2023-04-05: qty 15

## 2023-04-05 MED ORDER — CEFAZOLIN SODIUM-DEXTROSE 2-4 GM/100ML-% IV SOLN
2.0000 g | INTRAVENOUS | Status: AC
  Administered 2023-04-05: 2 g via INTRAVENOUS

## 2023-04-05 MED ORDER — BUPIVACAINE-EPINEPHRINE (PF) 0.25% -1:200000 IJ SOLN
INTRAMUSCULAR | Status: AC
  Filled 2023-04-05: qty 30

## 2023-04-05 MED ORDER — EPHEDRINE SULFATE-NACL 50-0.9 MG/10ML-% IV SOSY
PREFILLED_SYRINGE | INTRAVENOUS | Status: DC | PRN
  Administered 2023-04-05: 5 mg via INTRAVENOUS
  Administered 2023-04-05: 10 mg via INTRAVENOUS
  Administered 2023-04-05 (×2): 5 mg via INTRAVENOUS

## 2023-04-05 MED ORDER — FENTANYL CITRATE (PF) 250 MCG/5ML IJ SOLN
INTRAMUSCULAR | Status: AC
  Filled 2023-04-05: qty 5

## 2023-04-05 MED ORDER — ORAL CARE MOUTH RINSE: 15.0000 mL | Freq: Once | OROMUCOSAL | Status: AC

## 2023-04-05 MED ORDER — LACTATED RINGERS IV SOLN
INTRAVENOUS | Status: DC
Start: 2023-04-05 — End: 2023-04-06

## 2023-04-05 MED ORDER — 0.9 % SODIUM CHLORIDE (POUR BTL) OPTIME
TOPICAL | Status: DC | PRN
  Administered 2023-04-05: 1000 mL

## 2023-04-05 MED ORDER — PROPOFOL 10 MG/ML IV BOLUS
INTRAVENOUS | Status: AC
  Filled 2023-04-05: qty 20

## 2023-04-05 MED ORDER — LIDOCAINE 2% (20 MG/ML) 5 ML SYRINGE
INTRAMUSCULAR | Status: DC | PRN
  Administered 2023-04-05: 100 mg via INTRAVENOUS

## 2023-04-05 MED ORDER — ONDANSETRON HCL 4 MG/2ML IJ SOLN
4.0000 mg | Freq: Once | INTRAMUSCULAR | Status: DC | PRN
Start: 2023-04-05 — End: 2023-04-07

## 2023-04-05 MED ORDER — CHLORHEXIDINE GLUCONATE 0.12 % MT SOLN: 15.0000 mL | Freq: Once | OROMUCOSAL | Status: AC

## 2023-04-05 MED ORDER — FENTANYL CITRATE (PF) 100 MCG/2ML IJ SOLN
25.0000 ug | INTRAMUSCULAR | Status: DC | PRN
  Administered 2023-04-05: 50 ug via INTRAVENOUS

## 2023-04-05 MED ORDER — GLYCOPYRROLATE PF 0.2 MG/ML IJ SOSY
PREFILLED_SYRINGE | INTRAMUSCULAR | Status: DC | PRN
  Administered 2023-04-05: .2 mg via INTRAVENOUS

## 2023-04-05 MED ORDER — ACETAMINOPHEN 500 MG PO TABS
ORAL_TABLET | ORAL | Status: AC
  Administered 2023-04-05: 1000 mg via ORAL
  Filled 2023-04-05: qty 2

## 2023-04-05 MED ORDER — OXYCODONE HCL 5 MG/5ML PO SOLN: 5.0000 mg | Freq: Once | ORAL | Status: AC | PRN

## 2023-04-05 MED ORDER — LACTATED RINGERS IV SOLN: INTRAVENOUS | Status: DC | PRN

## 2023-04-05 SURGICAL SUPPLY — 37 items
APPLIER CLIP 5 13 M/L LIGAMAX5 (MISCELLANEOUS) ×1 IMPLANT
BAG COUNTER SPONGE SURGICOUNT (BAG) ×1 IMPLANT
BLADE CLIPPER SURG (BLADE) IMPLANT
CANISTER SUCT 3000ML PPV (MISCELLANEOUS) ×1 IMPLANT
CATH REDDICK CHOLANGI 4FR 50CM (CATHETERS) ×1 IMPLANT
CHLORAPREP W/TINT 26 (MISCELLANEOUS) ×1 IMPLANT
CLIP APPLIE 5 13 M/L LIGAMAX5 (MISCELLANEOUS) ×1 IMPLANT
COVER MAYO STAND STRL (DRAPES) ×1 IMPLANT
COVER SURGICAL LIGHT HANDLE (MISCELLANEOUS) ×1 IMPLANT
DERMABOND ADVANCED .7 DNX12 (GAUZE/BANDAGES/DRESSINGS) ×1 IMPLANT
DRAPE C-ARM 42X120 X-RAY (DRAPES) ×1 IMPLANT
ELECT REM PT RETURN 9FT ADLT (ELECTROSURGICAL) ×1 IMPLANT
ELECTRODE REM PT RTRN 9FT ADLT (ELECTROSURGICAL) ×1 IMPLANT
GLOVE BIO SURGEON STRL SZ7.5 (GLOVE) ×1 IMPLANT
GOWN STRL REUS W/ TWL LRG LVL3 (GOWN DISPOSABLE) ×3 IMPLANT
IRRIG SUCT STRYKERFLOW 2 WTIP (MISCELLANEOUS) ×1 IMPLANT
IRRIGATION SUCT STRKRFLW 2 WTP (MISCELLANEOUS) ×1 IMPLANT
IV CATH 14GX2 1/4 (CATHETERS) ×1 IMPLANT
KIT BASIN OR (CUSTOM PROCEDURE TRAY) ×1 IMPLANT
KIT IMAGING PINPOINTPAQ (MISCELLANEOUS) IMPLANT
KIT TURNOVER KIT B (KITS) ×1 IMPLANT
NS IRRIG 1000ML POUR BTL (IV SOLUTION) ×1 IMPLANT
PAD ARMBOARD 7.5X6 YLW CONV (MISCELLANEOUS) ×1 IMPLANT
SCISSORS LAP 5X35 DISP (ENDOMECHANICALS) ×1 IMPLANT
SET TUBE SMOKE EVAC HIGH FLOW (TUBING) ×1 IMPLANT
SLEEVE Z-THREAD 5X100MM (TROCAR) ×2 IMPLANT
SPECIMEN JAR SMALL (MISCELLANEOUS) ×1 IMPLANT
SUT MNCRL AB 4-0 PS2 18 (SUTURE) ×1 IMPLANT
SYS BAG RETRIEVAL 10MM (BASKET) ×1 IMPLANT
SYSTEM BAG RETRIEVAL 10MM (BASKET) ×1 IMPLANT
TOWEL GREEN STERILE (TOWEL DISPOSABLE) ×1 IMPLANT
TOWEL GREEN STERILE FF (TOWEL DISPOSABLE) ×1 IMPLANT
TRAY LAPAROSCOPIC MC (CUSTOM PROCEDURE TRAY) ×1 IMPLANT
TROCAR BALLN 12MMX100 BLUNT (TROCAR) ×1 IMPLANT
TROCAR Z-THREAD OPTICAL 5X100M (TROCAR) ×1 IMPLANT
WARMER LAPAROSCOPE (MISCELLANEOUS) ×1 IMPLANT
WATER STERILE IRR 1000ML POUR (IV SOLUTION) ×1 IMPLANT

## 2023-04-05 NOTE — Transfer of Care (Signed)
Immediate Anesthesia Transfer of Care Note  Patient: Jane Brown  Procedure(s) Performed: LAPAROSCOPIC CHOLECYSTECTOMY WITH INTRAOPERATIVE CHOLANGIOGRAM (Abdomen)  Patient Location: PACU  Anesthesia Type:General  Level of Consciousness: drowsy  Airway & Oxygen Therapy: Patient Spontanous Breathing  Post-op Assessment: Report given to RN  Post vital signs: stable  Last Vitals:  Vitals Value Taken Time  BP 139/77 04/05/23 1134  Temp 36.8 C 04/05/23 1134  Pulse 81 04/05/23 1136  Resp 17 04/05/23 1136  SpO2 91 % 04/05/23 1136  Vitals shown include unfiled device data.  Last Pain:  Vitals:   04/05/23 0849  TempSrc:   PainSc: 0-No pain         Complications: No notable events documented.

## 2023-04-05 NOTE — Anesthesia Procedure Notes (Addendum)
Procedure Name: Intubation Date/Time: 04/05/2023 10:18 AM  Performed by: Halina Andreas, CRNAPre-anesthesia Checklist: Patient identified, Emergency Drugs available, Suction available, Patient being monitored and Timeout performed Patient Re-evaluated:Patient Re-evaluated prior to induction Oxygen Delivery Method: Circle system utilized Preoxygenation: Pre-oxygenation with 100% oxygen Induction Type: IV induction Ventilation: Mask ventilation without difficulty and Oral airway inserted - appropriate to patient size Laryngoscope Size: Mac and 3 Grade View: Grade I Tube type: Oral Tube size: 7.0 mm Number of attempts: 1 Airway Equipment and Method: Stylet Placement Confirmation: ETT inserted through vocal cords under direct vision Secured at: 23 cm Tube secured with: Tape Dental Injury: Teeth and Oropharynx as per pre-operative assessment

## 2023-04-05 NOTE — H&P (Signed)
REFERRING PHYSICIAN: Laurann Montana, MD PROVIDER: Lindell Noe, MD MRN: [id number] DOB: [birth year] Subjective   Chief Complaint: New Consultation (Gallstones)  History of Present Illness: Jane Brown is a 67 y.o. female who is seen today as an office consultation for evaluation of New Consultation (Gallstones)  We are asked to see the patient in consultation by Dr. Laurann Montana to evaluate her for gallstones. The patient is a 67 year old white female who has been experiencing both upper and lower abdominal pain. The upper pain has been in her right upper quadrant. She experiences this every couple days. She does report that it seems to radiate into her back. She has not had nausea or vomiting associated with it. The other pain has been more low down that she associates with gallstones. As part of her workup she underwent CT and ultrasound and MRI that did show stones in the gallbladder but no gallbladder wall thickening. She was also noted to have a benign-appearing hemangioma in the right lobe of the liver. She is otherwise in pretty good health and quit smoking about 30 years ago. She has no history of abdominal surgery  Review of Systems: A complete review of systems was obtained from the patient. I have reviewed this information and discussed as appropriate with the patient. See HPI as well for other ROS.  ROS   Medical History: Past Medical History:  Diagnosis Date  GERD (gastroesophageal reflux disease)  Hyperlipidemia   Patient Active Problem List  Diagnosis  Calculus of gallbladder without cholecystitis without obstruction   Past Surgical History:  Procedure Laterality Date  JOINT REPLACEMENT  RT/L KNEE and TOTAL HIP    Allergies  Allergen Reactions  Penicillin Hives  Sulfa (Sulfonamide Antibiotics) Headache   Current Outpatient Medications on File Prior to Visit  Medication Sig Dispense Refill  co-enzyme Q-10, ubiquinone, (CO Q-10) 100 mg capsule Take 1  capsule by mouth once daily  cyanocobalamin (VITAMIN B12) 1000 MCG tablet 1 tablet Orally Once a day  FLUoxetine (PROZAC) 40 MG capsule TAKE 1 CAPSULE EVERY DAY for 90 days  omeprazole (PRILOSEC) 20 MG DR capsule TAKE 1 CAPSULE EVERY DAY for 90 days  rosuvastatin (CRESTOR) 5 MG tablet take 1 tablet twice weekly for 90 days  valsartan (DIOVAN) 160 MG tablet TAKE 1 TABLET EVERY DAY for 90 days   No current facility-administered medications on file prior to visit.   Family History  Problem Relation Age of Onset  Hyperlipidemia (Elevated cholesterol) Mother  Skin cancer Father  High blood pressure (Hypertension) Father    Social History   Tobacco Use  Smoking Status Former  Types: Cigarettes  Smokeless Tobacco Never    Social History   Socioeconomic History  Marital status: Single  Tobacco Use  Smoking status: Former  Types: Cigarettes  Smokeless tobacco: Never  Vaping Use  Vaping status: Never Used  Substance and Sexual Activity  Alcohol use: Not Currently  Drug use: Never   Objective:   Vitals:  BP: 136/80  Pulse: 72  Temp: 36.7 C (98.1 F)  SpO2: 96%  Weight: (!) 107 kg (236 lb)  Height: 167.6 cm (5\' 6" )  PainSc: 0-No pain   Body mass index is 38.09 kg/m.  Physical Exam Vitals reviewed.  Constitutional:  General: She is not in acute distress. Appearance: Normal appearance.  HENT:  Head: Normocephalic and atraumatic.  Right Ear: External ear normal.  Left Ear: External ear normal.  Nose: Nose normal.  Mouth/Throat:  Mouth: Mucous membranes are moist.  Pharynx:  Oropharynx is clear.  Eyes:  General: No scleral icterus. Extraocular Movements: Extraocular movements intact.  Conjunctiva/sclera: Conjunctivae normal.  Pupils: Pupils are equal, round, and reactive to light.  Cardiovascular:  Rate and Rhythm: Normal rate and regular rhythm.  Pulses: Normal pulses.  Heart sounds: Normal heart sounds.  Pulmonary:  Effort: Pulmonary effort is normal. No  respiratory distress.  Breath sounds: Normal breath sounds.  Abdominal:  General: Bowel sounds are normal.  Palpations: Abdomen is soft.  Tenderness: There is no abdominal tenderness.  Comments: There is mild tenderness to palpation in the right upper quadrant. There is no palpable mass  Musculoskeletal:  General: No swelling, tenderness or deformity. Normal range of motion.  Cervical back: Normal range of motion and neck supple.  Skin: General: Skin is warm and dry.  Coloration: Skin is not jaundiced.  Neurological:  General: No focal deficit present.  Mental Status: She is alert and oriented to person, place, and time.  Psychiatric:  Mood and Affect: Mood normal.  Behavior: Behavior normal.     Labs, Imaging and Diagnostic Testing:  Assessment and Plan:   Diagnoses and all orders for this visit:  Calculus of gallbladder without cholecystitis without obstruction - [facility] Case Posting Request; Future   The patient appears to have symptomatic gallstones. Because of the risk of further painful episodes and possible pancreatitis I feel she would probably benefit from having her gallbladder removed. She would also like to have this done. I have discussed with her in detail the risks and benefits of the operation as well as some of the technical aspects including the risk of common duct injury and she understands and wishes to proceed.

## 2023-04-05 NOTE — Op Note (Signed)
04/05/2023  11:19 AM  PATIENT:  Jane Brown  67 y.o. female  PRE-OPERATIVE DIAGNOSIS:  GALLSTONES  POST-OPERATIVE DIAGNOSIS:  GALLSTONES  PROCEDURE:  Procedure(s): LAPAROSCOPIC CHOLECYSTECTOMY   SURGEON:  Surgeons and Role:    * Griselda Miner, MD - Primary  PHYSICIAN ASSISTANT:   ASSISTANTS: Rockwell Germany, RNFA  ANESTHESIA:   local and general  EBL:  minimal   BLOOD ADMINISTERED:none  DRAINS: none   LOCAL MEDICATIONS USED:  MARCAINE     SPECIMEN:  Source of Specimen:  gallbladder  DISPOSITION OF SPECIMEN:  PATHOLOGY  COUNTS:  YES  TOURNIQUET:  * No tourniquets in log *  DICTATION: .Dragon Dictation    Procedure: After informed consent was obtained the patient was brought to the operating room and placed in the supine position on the operating room table. After adequate induction of general anesthesia the patient's abdomen was prepped with ChloraPrep allowed to dry and draped in usual sterile manner. An appropriate timeout was performed. The area below the umbilicus was infiltrated with quarter percent  Marcaine. A small incision was made with a 15 blade knife. The incision was carried down through the subcutaneous tissue bluntly with a hemostat and [employer] retractors. The linea alba was identified. The linea alba was incised with a 15 blade knife and each side was grasped with Coker clamps. The preperitoneal space was then probed with a hemostat until the peritoneum was opened and access was gained to the abdominal cavity. A 0 Vicryl pursestring stitch was placed in the fascia surrounding the opening. A Hassan cannula was then placed through the opening and anchored in place with the previously placed Vicryl purse string stitch. The abdomen was insufflated with carbon dioxide without difficulty. A laparoscope was inserted through the [facility] cannula in the right upper quadrant was inspected. Next the epigastric region was infiltrated with % Marcaine. A small incision  was made with a 15 blade knife. A 5 mm port was placed bluntly through this incision into the abdominal cavity under direct vision. Next 2 sites were chosen laterally on the right side of the abdomen for placement of 5 mm ports. Each of these areas was infiltrated with quarter percent Marcaine. Small stab incisions were made with a 15 blade knife. 5 mm ports were then placed bluntly through these incisions into the abdominal cavity under direct vision without difficulty. A blunt grasper was placed through the lateralmost 5 mm port and used to grasp the dome of the gallbladder and elevate it anteriorly and superiorly.  The liver had a nodular appearance consistent with cirrhosis.  Care was taken to avoid injury to the liver.  Another blunt grasper was placed through the other 5 mm port and used to retract the body and neck of the gallbladder. A dissector was placed through the epigastric port and using the electrocautery the peritoneal reflection at the gallbladder neck was opened. Blunt dissection was then carried out in this area until the gallbladder neck-cystic duct junction was readily identified and a good critical window was created.  Because of the size of the gallbladder and the risk of injuring the liver I elected not to get a cholangiogram.  We could see her anatomy well.  Her liver functions were normal.  3 clips were placed proximally on the cystic duct and the duct was divided between the 2 sets of clips. Posterior to this the cystic artery was identified and again dissected bluntly in a circumferential manner until a good window  was created.  2 clips were placed proximally and one distally on the artery and the artery was divided between the 2 sets of clips. Next a laparoscopic hook cautery device was used to separate the gallbladder from the liver bed. Prior to completely detaching the gallbladder from the liver bed the liver bed was inspected and several small bleeding points were coagulated with the  electrocautery until the area was completely hemostatic. The gallbladder was then detached the rest of it from the liver bed without difficulty.  A small vessel on the liver bed was also controlled with a clip.  A laparoscopic bag was inserted through the hassan port. The laparoscope was moved to the epigastric port. The gallbladder was placed within the bag and the bag was sealed.  The bag with the gallbladder was then removed with the [facility] cannula through the infraumbilical port without difficulty. The fascial defect was then closed with the previously placed Vicryl pursestring stitch as well as with another figure-of-eight 0 Vicryl stitch. The liver bed was inspected again and found to be hemostatic. The abdomen was irrigated with copious amounts of saline until the effluent was clear. The ports were then removed under direct vision without difficulty and were found to be hemostatic. The gas was allowed to escape. No other abnormalities were noted on general inspection of the abdomen. The skin incisions were all closed with interrupted 4-0 Monocryl subcuticular stitches. Dermabond dressings were applied. The patient tolerated the procedure well. At the end of the case all needle sponge and instrument counts were correct. The patient was then awakened and taken to recovery in stable condition  PLAN OF CARE: Discharge to home after PACU  PATIENT DISPOSITION:  PACU - hemodynamically stable.   Delay start of Pharmacological VTE agent (>24hrs) due to surgical blood loss or risk of bleeding: not applicable

## 2023-04-05 NOTE — Interval H&P Note (Signed)
History and Physical Interval Note:  04/05/2023 9:51 AM  Jane Brown  has presented today for surgery, with the diagnosis of GALLSTONES.  The various methods of treatment have been discussed with the patient and family. After consideration of risks, benefits and other options for treatment, the patient has consented to  Procedure(s): LAPAROSCOPIC CHOLECYSTECTOMY WITH INTRAOPERATIVE CHOLANGIOGRAM (N/A) as a surgical intervention.  The patient's history has been reviewed, patient examined, no change in status, stable for surgery.  I have reviewed the patient's chart and labs.  Questions were answered to the patient's satisfaction.     [name]

## 2023-04-05 NOTE — Anesthesia Preprocedure Evaluation (Signed)
Anesthesia Evaluation  Patient identified by MRN, date of birth, ID band Patient awake  General Assessment Comment:PONV in mother  Reviewed: Allergy & Precautions, NPO status , Patient's Chart, lab work & pertinent test results  Airway Mallampati: II       Dental  (+) Partial Lower, Upper Dentures   Pulmonary neg pulmonary ROS, former smoker   Pulmonary exam normal        Cardiovascular hypertension, Pt. on medications Normal cardiovascular exam     Neuro/Psych  PSYCHIATRIC DISORDERS Anxiety Depression    negative neurological ROS     GI/Hepatic Neg liver ROS,GERD  Medicated and Controlled,,  Endo/Other  diabetes, Well Controlled, Type 2    Renal/GU negative Renal ROS  negative genitourinary   Musculoskeletal  (+) Arthritis , Osteoarthritis,    Abdominal  (+) + obese  Peds  Hematology negative hematology ROS (+)   Anesthesia Other Findings   Reproductive/Obstetrics                             Anesthesia Physical Anesthesia Plan  ASA: II  Anesthesia Plan:    Post-op Pain Management: Dilaudid IV   Induction: Intravenous  PONV Risk Score and Plan: 3 and Ondansetron, Midazolam and Treatment may vary due to age or medical condition  Airway Management Planned: Oral ETT  Additional Equipment: None  Intra-op Plan:   Post-operative Plan: Extubation in OR  Informed Consent: I have reviewed the patients History and Physical, chart, labs and discussed the procedure including the risks, benefits and alternatives for the proposed anesthesia with the patient or authorized representative who has indicated his/her understanding and acceptance.     Dental advisory given  Plan Discussed with: CRNA  Anesthesia Plan Comments: (Lab Results      Component                Value               Date                      WBC                      6.5                 04/05/2020                HGB                       13.0                04/05/2020                HCT                      40.4                04/05/2020                MCV                      87.4                04/05/2020                PLT  172                 04/05/2020          )        Anesthesia Quick Evaluation

## 2023-04-06 ENCOUNTER — Encounter (HOSPITAL_COMMUNITY): Payer: Self-pay | Admitting: General Surgery

## 2023-04-10 NOTE — Anesthesia Postprocedure Evaluation (Signed)
Anesthesia Post Note  Patient: Jane Brown  Procedure(s) Performed: LAPAROSCOPIC CHOLECYSTECTOMY WITH INTRAOPERATIVE CHOLANGIOGRAM (Abdomen)     Patient location during evaluation: PACU Anesthesia Type: General Level of consciousness: awake and sedated Pain management: pain level controlled Vital Signs Assessment: post-procedure vital signs reviewed and stable Respiratory status: spontaneous breathing Cardiovascular status: stable Postop Assessment: no apparent nausea or vomiting Anesthetic complications: no  No notable events documented.  Last Vitals:  Vitals:   04/05/23 1200 04/05/23 1205  BP: 131/76 128/75  Pulse: 76 78  Resp: 15 15  Temp:  36.8 C  SpO2: 94% 96%    Last Pain:  Vitals:   04/05/23 1145  TempSrc:   PainSc: 6                  John F [address]

## 2023-04-13 LAB — SURGICAL PATHOLOGY

## 2023-05-11 DIAGNOSIS — K7460 Unspecified cirrhosis of liver: Secondary | ICD-10-CM | POA: Diagnosis not present

## 2023-05-17 DIAGNOSIS — K449 Diaphragmatic hernia without obstruction or gangrene: Secondary | ICD-10-CM | POA: Diagnosis not present

## 2023-05-17 DIAGNOSIS — K3189 Other diseases of stomach and duodenum: Secondary | ICD-10-CM | POA: Diagnosis not present

## 2023-05-17 DIAGNOSIS — K2970 Gastritis, unspecified, without bleeding: Secondary | ICD-10-CM | POA: Diagnosis not present

## 2023-05-17 DIAGNOSIS — K7460 Unspecified cirrhosis of liver: Secondary | ICD-10-CM | POA: Diagnosis not present

## 2023-05-17 DIAGNOSIS — K766 Portal hypertension: Secondary | ICD-10-CM | POA: Diagnosis not present

## 2023-05-17 DIAGNOSIS — K222 Esophageal obstruction: Secondary | ICD-10-CM | POA: Diagnosis not present

## 2023-05-28 DIAGNOSIS — N9489 Other specified conditions associated with female genital organs and menstrual cycle: Secondary | ICD-10-CM | POA: Diagnosis not present

## 2023-05-28 DIAGNOSIS — R102 Pelvic and perineal pain: Secondary | ICD-10-CM | POA: Diagnosis not present

## 2023-05-28 DIAGNOSIS — N83202 Unspecified ovarian cyst, left side: Secondary | ICD-10-CM | POA: Diagnosis not present

## 2023-06-04 DIAGNOSIS — N390 Urinary tract infection, site not specified: Secondary | ICD-10-CM | POA: Diagnosis not present

## 2023-08-06 DIAGNOSIS — K7460 Unspecified cirrhosis of liver: Secondary | ICD-10-CM | POA: Diagnosis not present

## 2023-08-07 DIAGNOSIS — Z6836 Body mass index (BMI) 36.0-36.9, adult: Secondary | ICD-10-CM | POA: Diagnosis not present

## 2023-08-07 DIAGNOSIS — N898 Other specified noninflammatory disorders of vagina: Secondary | ICD-10-CM | POA: Diagnosis not present

## 2023-08-07 DIAGNOSIS — R300 Dysuria: Secondary | ICD-10-CM | POA: Diagnosis not present

## 2023-08-29 DIAGNOSIS — E785 Hyperlipidemia, unspecified: Secondary | ICD-10-CM | POA: Diagnosis not present

## 2023-08-29 DIAGNOSIS — R0609 Other forms of dyspnea: Secondary | ICD-10-CM | POA: Diagnosis not present

## 2023-08-29 DIAGNOSIS — M51362 Other intervertebral disc degeneration, lumbar region with discogenic back pain and lower extremity pain: Secondary | ICD-10-CM | POA: Diagnosis not present

## 2023-08-29 DIAGNOSIS — E1169 Type 2 diabetes mellitus with other specified complication: Secondary | ICD-10-CM | POA: Diagnosis not present

## 2023-08-29 DIAGNOSIS — F330 Major depressive disorder, recurrent, mild: Secondary | ICD-10-CM | POA: Diagnosis not present

## 2023-08-29 DIAGNOSIS — I10 Essential (primary) hypertension: Secondary | ICD-10-CM | POA: Diagnosis not present

## 2023-08-29 DIAGNOSIS — F419 Anxiety disorder, unspecified: Secondary | ICD-10-CM | POA: Diagnosis not present

## 2023-08-29 DIAGNOSIS — K219 Gastro-esophageal reflux disease without esophagitis: Secondary | ICD-10-CM | POA: Diagnosis not present

## 2023-09-03 NOTE — H&P (Signed)
 Jane Brown is an 68 y.o. postmenopausal G1P1 who is admitted for Hysteroscopy with Dilation and Curettage with Myosure polypectomy/submucosal fibroid resection for endometrial masses.  Patient had TVUS due to pelvic pain (see below). Ultimately, he rpelvic pain improved after cholecystectomy. Given multiple endometrial masses found on the endometrium, it was recommended she have hysteroscopic evaluation. She has not had any PMB.  Work-up: Pap smear (01/11/2021): NILM/HRHPV negative  02/06/2023 TVUS Uterus: 5.65 x 2.41 x 3.63 cm Right Ovary 2.69 cm  Left Ovary 3.05 cm Comments:Anteverted uterus. Endometrium thickened.Three hyperechoic masses seen around endometrial cavity- difficult to determine whether endometrial masses vs echogenic masses/fibroids within myometrium. 1.) Hyperechoic mass posterior fundal 1.1 x 1.0 x 0.9 cm, 2.) hyperechoic mass fundal 0.6 x 0.6 x 0.5 cm, 3.) hyperechoic mass anterior 0.5 x 0.4 x 0.4 cm; all avascular.On 3D imaging, all appear to be just outside endometrial cavity. Right ovary WNL. Left ovary- simple cyst 2.2 x 1.9 x 1.6 cm, avascular.No adnexal masses seen.  11/22/2022 CT Abdomen Reproductive: Uterus and right ovary unremarkable. 1.8 cm low- density lesion in the left ovary.  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 Patient is postmenopausal.    Past Medical History:  Diagnosis Date   Allergy    Anxiety     Depression    Diabetes mellitus without complication (HCC)    type 2   Family history of adverse reaction to anesthesia    mother has post-op nausea and vomiting   GERD (gastroesophageal reflux disease)    Hemorrhoids    History of hiatal hernia    Hypertension    Primary localized osteoarthritis of left knee 11/20/2017   Primary localized osteoarthritis of right knee 03/19/2018   Rectal bleeding    Restless leg syndrome    Wears dentures     Past Surgical History:  Procedure Laterality Date   CHOLECYSTECTOMY N/A 04/05/2023   Procedure: LAPAROSCOPIC CHOLECYSTECTOMY WITH INTRAOPERATIVE CHOLANGIOGRAM;  Surgeon: Caralyn Chandler, MD;  Location: [facility] OR;  Service: General;  Laterality: N/A;   PARTIAL KNEE ARTHROPLASTY Left 11/20/2017   Procedure: UNICOMPARTMENTAL LEFT KNEE;  Surgeon: Osa Blase, MD;  Location: [facility] OR;  Service: Orthopedics;  Laterality: Left;   PARTIAL KNEE ARTHROPLASTY     Dr. Agatha Horsfall 03-19-18 Right   PARTIAL KNEE ARTHROPLASTY Right 03/19/2018   Procedure: UNICOMPARTMENTAL KNEE;  Surgeon: Osa Blase, MD;  Location: [facility] ORS;  Service: Orthopedics;  Laterality: Right;  Adductor Block   TOTAL HIP ARTHROPLASTY Right 04/13/2020   Procedure: TOTAL HIP ARTHROPLASTY;  Surgeon: Osa Blase, MD;  Location: [facility] ORS;  Service: Orthopedics;  Laterality: Right;    Family History  Problem Relation Age of Onset   Cancer Father        mouth cancer    Social History:  reports that she quit smoking about 48 years ago. Her smoking use included cigarettes. She has never used smokeless tobacco. She reports that she does not drink alcohol and does not use drugs.  ALLERGIES/MEDS:  Allergies:  Allergies  Allergen Reactions   Hydrocodone      Headaches    Lisinopril Cough   Penicillins Hives, Itching and Other (See Comments)    ALL cillin drugs!!!!  tolerated Ancef  04/13/20     No medications prior to admission.     Review of Systems  All other systems reviewed and are  negative.   There were no vitals taken for this visit. Gen:  NAD, pleasant and cooperative Cardio:  RRR Pulm:  CTAB, no wheezes/rales/rhonchi Abd:  Soft, non-distended, non-tender throughout, no rebound/guarding Ext:  No bilateral LE edema, no bilateral calf tenderness  No results found for this or any previous visit (from the past 24 hours).  No results found.   ASSESSMENT/PLAN: Jane Brown is a 68 y.o. postmenopausal G1P1 who is admitted for Hysteroscopy with Dilation and Curettage with Myosure polypectomy/submucosal fibroid resection for endometrial masses.  - Admit to [facility] OR - Admit labs (CBC, T&S) - Diet:  Per anesthesia/ERAS pathway - IVF:  Per anesthesia - VTE Prophylaxis:  SCDs - Antibiotics: None - D/C home same-day  Consents: I discussed with the patient that this surgery is performed to look inside the uterus and remove the uterine lining.  Prior to surgery, the risks and benefits of the surgery, as well as alternative treatments, have been discussed.  The risks include, but are not limited to bleeding, including the need for a blood transfusion, infection, damage to organs and tissues, including uterine perforation, requiring additional surgery, postoperative pain, short-term and long-term, failure of the procedure to control symptoms, need for hysterectomy to control bleeding, fluid overload, which could create electrolyte abnormalities and the need to stop the procedure before completion, inability to safely complete the procedure, deep vein thrombosis and/or pulmonary embolism, painful intercourse, complications the course of which cannot be predicted or prevented, and death.  Patient was consented for blood products.  The patient is aware that bleeding may result in the need for a blood transfusion which includes risk of transmission of HIV (1:2 million), Hepatitis C (1:2 million), and Hepatitis B (1:200 thousand) and transfusion reaction.  Patient voiced  understanding of the above risks as well as understanding of indications for blood transfusion.  Melia Hopes, DO

## 2023-09-04 DIAGNOSIS — Z01818 Encounter for other preprocedural examination: Secondary | ICD-10-CM | POA: Diagnosis not present

## 2023-09-04 DIAGNOSIS — N9489 Other specified conditions associated with female genital organs and menstrual cycle: Secondary | ICD-10-CM | POA: Diagnosis not present

## 2023-09-11 ENCOUNTER — Encounter (HOSPITAL_COMMUNITY): Payer: Self-pay | Admitting: Obstetrics and Gynecology

## 2023-09-11 NOTE — Progress Notes (Signed)
 Spoke w/ via phone for pre-op interview--- Adell Hones Lab needs dos---- CBC and T&S per surgeon. BMP, CBG and A1C per anesthesia.        Lab results------EKG in Epic dated 04/02/23. COVID test -----patient states asymptomatic no test needed Arrive at -------0840 NPO after MN NO Solid Food.  Clear liquids from MN until---0740 Pre-Surgery Ensure or G2:  Med rec completed Medications to take morning of surgery -----NONE Diabetic medication -----  GLP1 agonist last dose:Ozempic last dose 09/07/23.  GLP1 instructions: Pt verbalized understanding to not take any further doses until after procedure.  Patient instructed no nail polish to be worn day of surgery Patient instructed to bring photo id and insurance card day of surgery Patient aware to have Driver (ride ) / caregiver    for 24 hours after surgery - Fiance Andy Bannister Patient Special Instructions ----- Shower with antibacterial soap. Pre-Op special Instructions -----  Patient verbalized understanding of instructions that were given at this phone interview. Patient denies chest pain, sob, fever, cough at the interview.

## 2023-09-18 NOTE — Anesthesia Preprocedure Evaluation (Addendum)
 Anesthesia Evaluation  Patient identified by MRN, date of birth, ID band Patient awake    Reviewed: Allergy & Precautions, NPO status , Patient's Chart, lab work & pertinent test results  Airway Mallampati: I  TM Distance: >3 FB Neck ROM: Full    Dental  (+) Edentulous Upper, Edentulous Lower, Dental Advisory Given   Pulmonary former smoker   Pulmonary exam normal breath sounds clear to auscultation       Cardiovascular hypertension, Pt. on medications Normal cardiovascular exam Rhythm:Regular Rate:Normal     Neuro/Psych  PSYCHIATRIC DISORDERS Anxiety Depression    negative neurological ROS     GI/Hepatic Neg liver ROS, hiatal hernia,GERD  Medicated,,  Endo/Other  diabetes, Type 2    Renal/GU negative Renal ROS  negative genitourinary   Musculoskeletal negative musculoskeletal ROS (+)    Abdominal   Peds  Hematology negative hematology ROS (+)   Anesthesia Other Findings   Reproductive/Obstetrics                             Anesthesia Physical Anesthesia Plan  ASA: 2  Anesthesia Plan: General   Post-op Pain Management: Tylenol  PO (pre-op)*   Induction: Intravenous  PONV Risk Score and Plan: 3 and Ondansetron , Dexamethasone  and Midazolam   Airway Management Planned: LMA  Additional Equipment:   Intra-op Plan:   Post-operative Plan: Extubation in OR  Informed Consent: I have reviewed the patients History and Physical, chart, labs and discussed the procedure including the risks, benefits and alternatives for the proposed anesthesia with the patient or authorized representative who has indicated his/her understanding and acceptance.     Dental advisory given  Plan Discussed with: CRNA  Anesthesia Plan Comments:        Anesthesia Quick Evaluation

## 2023-09-19 ENCOUNTER — Ambulatory Visit (HOSPITAL_BASED_OUTPATIENT_CLINIC_OR_DEPARTMENT_OTHER): Admitting: Anesthesiology

## 2023-09-19 ENCOUNTER — Encounter (HOSPITAL_COMMUNITY)
Admission: RE | Disposition: A | Discharge status: Home / Self Care | Payer: Self-pay | Source: Ambulatory Visit | Attending: Obstetrics and Gynecology

## 2023-09-19 ENCOUNTER — Ambulatory Visit (HOSPITAL_COMMUNITY): Admitting: Anesthesiology

## 2023-09-19 ENCOUNTER — Ambulatory Visit (HOSPITAL_COMMUNITY)
Admission: RE | Admit: 2023-09-19 | Discharge: 2023-09-19 | Disposition: A | Discharge status: Home / Self Care | Payer: Medicare HMO | Source: Ambulatory Visit | Attending: Obstetrics and Gynecology | Admitting: Obstetrics and Gynecology

## 2023-09-19 ENCOUNTER — Encounter (HOSPITAL_COMMUNITY): Payer: Self-pay | Admitting: Obstetrics and Gynecology

## 2023-09-19 ENCOUNTER — Other Ambulatory Visit: Payer: Self-pay

## 2023-09-19 DIAGNOSIS — N840 Polyp of corpus uteri: Secondary | ICD-10-CM | POA: Diagnosis not present

## 2023-09-19 DIAGNOSIS — K219 Gastro-esophageal reflux disease without esophagitis: Secondary | ICD-10-CM | POA: Insufficient documentation

## 2023-09-19 DIAGNOSIS — I10 Essential (primary) hypertension: Secondary | ICD-10-CM | POA: Diagnosis not present

## 2023-09-19 DIAGNOSIS — Z87891 Personal history of nicotine dependence: Secondary | ICD-10-CM | POA: Insufficient documentation

## 2023-09-19 DIAGNOSIS — R102 Pelvic and perineal pain: Secondary | ICD-10-CM | POA: Diagnosis not present

## 2023-09-19 DIAGNOSIS — E119 Type 2 diabetes mellitus without complications: Secondary | ICD-10-CM | POA: Insufficient documentation

## 2023-09-19 DIAGNOSIS — N856 Intrauterine synechiae: Secondary | ICD-10-CM

## 2023-09-19 DIAGNOSIS — N8110 Cystocele, unspecified: Secondary | ICD-10-CM | POA: Diagnosis not present

## 2023-09-19 DIAGNOSIS — N9489 Other specified conditions associated with female genital organs and menstrual cycle: Secondary | ICD-10-CM

## 2023-09-19 HISTORY — PX: DILATATION & CURRETTAGE/HYSTEROSCOPY WITH RESECTOCOPE: SHX5572

## 2023-09-19 HISTORY — PX: MYOSURE RESECTION: SHX7611

## 2023-09-19 LAB — CBC
HCT: 35.8 % — ABNORMAL LOW (ref 36.0–46.0)
Hemoglobin: 11.6 g/dL — ABNORMAL LOW (ref 12.0–15.0)
MCH: 26.4 pg (ref 26.0–34.0)
MCHC: 32.4 g/dL (ref 30.0–36.0)
MCV: 81.5 fL (ref 80.0–100.0)
Platelets: 146 10*3/uL — ABNORMAL LOW (ref 150–400)
RBC: 4.39 MIL/uL (ref 3.87–5.11)
RDW: 14.4 % (ref 11.5–15.5)
WBC: 5.5 10*3/uL (ref 4.0–10.5)
nRBC: 0 % (ref 0.0–0.2)

## 2023-09-19 LAB — TYPE AND SCREEN
ABO/RH(D): A POS
Antibody Screen: NEGATIVE

## 2023-09-19 LAB — BASIC METABOLIC PANEL WITH GFR
Anion gap: 9 (ref 5–15)
BUN: 11 mg/dL (ref 8–23)
CO2: 22 mmol/L (ref 22–32)
Calcium: 8.7 mg/dL — ABNORMAL LOW (ref 8.9–10.3)
Chloride: 104 mmol/L (ref 98–111)
Creatinine, Ser: 0.92 mg/dL (ref 0.44–1.00)
GFR, Estimated: >60 mL/min (ref >60)
Glucose, Bld: 111 mg/dL — ABNORMAL HIGH (ref 70–99)
Potassium: 3.7 mmol/L (ref 3.5–5.1)
Sodium: 135 mmol/L (ref 135–145)

## 2023-09-19 LAB — GLUCOSE, CAPILLARY
Glucose-Capillary: 114 mg/dL — ABNORMAL HIGH (ref 70–99)
Glucose-Capillary: 112 mg/dL — ABNORMAL HIGH (ref 70–99)

## 2023-09-19 SURGERY — DILATATION & CURETTAGE/HYSTEROSCOPY WITH RESECTOCOPE
Anesthesia: General

## 2023-09-19 MED ORDER — CHLORHEXIDINE GLUCONATE 0.12 % MT SOLN
OROMUCOSAL | Status: AC
  Filled 2023-09-19: qty 15

## 2023-09-19 MED ORDER — ACETAMINOPHEN 500 MG PO TABS
ORAL_TABLET | ORAL | Status: AC
  Filled 2023-09-19: qty 2

## 2023-09-19 MED ORDER — MIDAZOLAM HCL 2 MG/2ML IJ SOLN
INTRAMUSCULAR | Status: DC | PRN
  Administered 2023-09-19: 2 mg via INTRAVENOUS

## 2023-09-19 MED ORDER — FENTANYL CITRATE (PF) 250 MCG/5ML IJ SOLN
INTRAMUSCULAR | Status: DC | PRN
Start: 2023-09-19 — End: 2023-09-19
  Administered 2023-09-19 (×2): 50 ug via INTRAVENOUS

## 2023-09-19 MED ORDER — DEXAMETHASONE SODIUM PHOSPHATE 10 MG/ML IJ SOLN
INTRAMUSCULAR | Status: DC | PRN
  Administered 2023-09-19: 5 mg via INTRAVENOUS

## 2023-09-19 MED ORDER — SODIUM CHLORIDE 0.9 % IR SOLN
Status: DC | PRN
  Administered 2023-09-19: 3000 mL

## 2023-09-19 MED ORDER — FENTANYL CITRATE (PF) 250 MCG/5ML IJ SOLN
INTRAMUSCULAR | Status: AC
  Filled 2023-09-19: qty 5

## 2023-09-19 MED ORDER — OXYCODONE HCL 5 MG/5ML PO SOLN: 5.0000 mg | Freq: Once | ORAL | Status: AC | PRN

## 2023-09-19 MED ORDER — ONDANSETRON HCL 4 MG/2ML IJ SOLN
INTRAMUSCULAR | Status: AC
  Filled 2023-09-19: qty 2

## 2023-09-19 MED ORDER — FENTANYL CITRATE (PF) 100 MCG/2ML IJ SOLN
25.0000 ug | INTRAMUSCULAR | Status: DC | PRN
  Administered 2023-09-19 (×2): 50 ug via INTRAVENOUS

## 2023-09-19 MED ORDER — OXYCODONE HCL 5 MG PO TABS
ORAL_TABLET | ORAL | Status: AC
  Filled 2023-09-19: qty 1

## 2023-09-19 MED ORDER — PHENYLEPHRINE 80 MCG/ML (10ML) SYRINGE FOR IV PUSH (FOR BLOOD PRESSURE SUPPORT)
PREFILLED_SYRINGE | INTRAVENOUS | Status: DC | PRN
  Administered 2023-09-19: 80 ug via INTRAVENOUS
  Administered 2023-09-19: 160 ug via INTRAVENOUS

## 2023-09-19 MED ORDER — PHENYLEPHRINE 80 MCG/ML (10ML) SYRINGE FOR IV PUSH (FOR BLOOD PRESSURE SUPPORT)
PREFILLED_SYRINGE | INTRAVENOUS | Status: AC
  Filled 2023-09-19: qty 10

## 2023-09-19 MED ORDER — FENTANYL CITRATE (PF) 100 MCG/2ML IJ SOLN
INTRAMUSCULAR | Status: AC
  Filled 2023-09-19: qty 2

## 2023-09-19 MED ORDER — INSULIN ASPART 100 UNIT/ML IJ SOLN: 0.0000 [IU] | INTRAMUSCULAR | Status: DC | PRN

## 2023-09-19 MED ORDER — ONDANSETRON HCL 4 MG/2ML IJ SOLN
INTRAMUSCULAR | Status: DC | PRN
  Administered 2023-09-19: 4 mg via INTRAVENOUS

## 2023-09-19 MED ORDER — SILVER NITRATE-POT NITRATE 75-25 % EX MISC
CUTANEOUS | Status: DC | PRN
  Administered 2023-09-19: 2

## 2023-09-19 MED ORDER — CHLORHEXIDINE GLUCONATE 0.12 % MT SOLN
15.0000 mL | Freq: Once | OROMUCOSAL | Status: AC
  Administered 2023-09-19: 15 mL via OROMUCOSAL

## 2023-09-19 MED ORDER — ORAL CARE MOUTH RINSE: 15.0000 mL | Freq: Once | OROMUCOSAL | Status: AC

## 2023-09-19 MED ORDER — MIDAZOLAM HCL 2 MG/2ML IJ SOLN
INTRAMUSCULAR | Status: AC
  Filled 2023-09-19: qty 2

## 2023-09-19 MED ORDER — PROPOFOL 10 MG/ML IV BOLUS
INTRAVENOUS | Status: AC
  Filled 2023-09-19: qty 20

## 2023-09-19 MED ORDER — OXYCODONE HCL 5 MG PO TABS
5.0000 mg | ORAL_TABLET | Freq: Once | ORAL | Status: AC | PRN
  Administered 2023-09-19: 5 mg via ORAL

## 2023-09-19 MED ORDER — ACETAMINOPHEN 500 MG PO TABS
1000.0000 mg | ORAL_TABLET | Freq: Once | ORAL | Status: AC
  Administered 2023-09-19: 1000 mg via ORAL

## 2023-09-19 MED ORDER — LACTATED RINGERS IV SOLN: INTRAVENOUS | Status: DC

## 2023-09-19 MED ORDER — DEXAMETHASONE SODIUM PHOSPHATE 10 MG/ML IJ SOLN
INTRAMUSCULAR | Status: AC
  Filled 2023-09-19: qty 1

## 2023-09-19 MED ORDER — AMISULPRIDE (ANTIEMETIC) 5 MG/2ML IV SOLN: 10.0000 mg | Freq: Once | INTRAVENOUS | Status: DC | PRN

## 2023-09-19 MED ORDER — IBUPROFEN 800 MG PO TABS: 800.0000 mg | ORAL_TABLET | Freq: Three times a day (TID) | ORAL | 0 refills | Status: AC | PRN

## 2023-09-19 MED ORDER — PROPOFOL 10 MG/ML IV BOLUS
INTRAVENOUS | Status: DC | PRN
  Administered 2023-09-19: 200 mg via INTRAVENOUS

## 2023-09-19 MED ORDER — LIDOCAINE 2% (20 MG/ML) 5 ML SYRINGE
INTRAMUSCULAR | Status: AC
  Filled 2023-09-19: qty 5

## 2023-09-19 MED ORDER — LIDOCAINE 2% (20 MG/ML) 5 ML SYRINGE
INTRAMUSCULAR | Status: DC | PRN
  Administered 2023-09-19: 100 mg via INTRAVENOUS

## 2023-09-19 SURGICAL SUPPLY — 16 items
CATH ROBINSON RED A/P 16FR (CATHETERS) IMPLANT
CNTNR URN SCR LID CUP LEK RST (MISCELLANEOUS) ×1 IMPLANT
COVER MAYO STAND STRL (DRAPES) ×1 IMPLANT
DEVICE MYOSURE LITE (MISCELLANEOUS) IMPLANT
DEVICE MYOSURE REACH (MISCELLANEOUS) IMPLANT
DILATOR CANAL MILEX (MISCELLANEOUS) IMPLANT
GLOVE BIO SURGEON STRL SZ 6.5 (GLOVE) ×1 IMPLANT
GLOVE BIOGEL PI IND STRL 7.0 (GLOVE) ×2 IMPLANT
GOWN STRL REUS W/ TWL LRG LVL3 (GOWN DISPOSABLE) ×2 IMPLANT
KIT PROCEDURE FLUENT (KITS) ×1 IMPLANT
KIT TURNOVER KIT B (KITS) ×1 IMPLANT
PACK VAGINAL MINOR WOMEN LF (CUSTOM PROCEDURE TRAY) ×1 IMPLANT
PAD OB MATERNITY 11 LF (PERSONAL CARE ITEMS) ×1 IMPLANT
SEAL ROD LENS SCOPE MYOSURE (ABLATOR) ×1 IMPLANT
TOWEL GREEN STERILE FF (TOWEL DISPOSABLE) ×1 IMPLANT
UNDERPAD 30X36 HEAVY ABSORB (UNDERPADS AND DIAPERS) ×1 IMPLANT

## 2023-09-19 NOTE — Discharge Instructions (Signed)
No acetaminophen/Tylenol until after 3:00 pm today if needed.     Post Anesthesia Home Care Instructions  Activity: Get plenty of rest for the remainder of the day. A responsible individual must stay with you for 24 hours following the procedure.  For the next 24 hours, DO NOT: -Drive a car -Paediatric nurse -Drink alcoholic beverages -Take any medication unless instructed by your physician -Make any legal decisions or sign important papers.  Meals: Start with liquid foods such as gelatin or soup. Progress to regular foods as tolerated. Avoid greasy, spicy, heavy foods. If nausea and/or vomiting occur, drink only clear liquids until the nausea and/or vomiting subsides. Call your physician if vomiting continues.  Special Instructions/Symptoms: Your throat may feel dry or sore from the anesthesia or the breathing tube placed in your throat during surgery. If this causes discomfort, gargle with warm salt water. The discomfort should disappear within 24 hours.

## 2023-09-19 NOTE — Transfer of Care (Signed)
 Immediate Anesthesia Transfer of Care Note  Patient: Jane Brown  Procedure(s) Performed: DILATATION & CURETTAGE/HYSTEROSCOPY WITH RESECTOCOPE MYOSURE RESECTION  Patient Location: PACU  Anesthesia Type:General  Level of Consciousness: awake, alert , and oriented  Airway & Oxygen Therapy: Patient Spontanous Breathing and Patient connected to face mask oxygen  Post-op Assessment: Report given to RN and Post -op Vital signs reviewed and stable  Post vital signs: Reviewed and stable  Last Vitals:  Vitals Value Taken Time  BP 159/84 09/19/23 1130  Temp 36.7 C 09/19/23 1130  Pulse 61 09/19/23 1130  Resp 17 09/19/23 1130  SpO2 97 % 09/19/23 1130  Vitals shown include unfiled device data.  Last Pain:  Vitals:   09/19/23 0859  TempSrc: Oral  PainSc: 0-No pain      Patients Stated Pain Goal: 7 (09/19/23 0859)  Complications: No notable events documented.

## 2023-09-19 NOTE — Interval H&P Note (Signed)
 History and Physical Interval Note:  09/19/2023 9:20 AM  Jane Brown  has presented today for surgery, with the diagnosis of Endometrial Mass.  The various methods of treatment have been discussed with the patient and family. After consideration of risks, benefits and other options for treatment, the patient has consented to  Procedure(s): DILATATION & CURETTAGE/HYSTEROSCOPY (N/A) MYOSURE RESECTION (N/A) as a surgical intervention.  The patient's history has been reviewed, patient examined, no change in status, stable for surgery.  I have reviewed the patient's chart and labs.  Questions were answered to the patient's satisfaction.     Nester Bachus

## 2023-09-19 NOTE — Op Note (Signed)
 Pre Op Dx:   1. Pelvic pain 2. Endometrial masses  Post Op Dx:   1. Pelvic pain 2. Endometrial synechiae  Procedure:   Hysteroscopy with Dilation and Curettage with Myosure   Surgeon:  Dr. Meldon Sport Assistants:  None Anesthesia:  LMA   EBL:  5cc  IVF:  250cc UOP:  Voided prior to arrival to OR Fluid Deficit:  285cc   Drains:  None Specimen removed:  Endometrial curettings - sent to pathology Device(s) implanted: None Case Type:  Clean-contaminated Findings:  Very atrophic/shortened ectocervix, Stage III cystocele, endocervical canal appeared normal. Endometrium with synechiae. Left tubal ostia visualized, unable to visualize right tubal ostia. Complications: None Indications:  68 y.o. postmenopausal G1P1 with resolved pelvic pain but incidental finding of multiple endometrial masses on the endometrium.  Description of each procedure:  After informed consent was obtained the patient was taken to the operating room in the dorsal supine position.  After administration of general anesthesia, the patient was placed in the dorsal lithotomy position and prepped and draped in the usual sterile fashion. A pre-operative time-out was completed.  The anterior lip of the cervix was grasped with a single-tooth tenaculum and the cervix was serially dilated to accommodate the hysteroscope.  The hysteroscope was advanced and the findings as above was noted. The Myosure Reach was used to resect the synechiae and endometrium. A sharp banjo curette was used to gently curettage the endometrium. The single-tooth tenaculum was removed and its sites were made hemostatic with silver nitrate and pressure.  Adequate hemostasis was noted.  The patient was awakened and extubated and appeared to have tolerated the procedure well.  All counts were correct.  Disposition:  PACU  Meldon Sport, DO

## 2023-09-19 NOTE — Anesthesia Postprocedure Evaluation (Signed)
 Anesthesia Post Note  Patient: Jane Brown  Procedure(s) Performed: DILATATION & CURETTAGE/HYSTEROSCOPY WITH RESECTOCOPE MYOSURE RESECTION     Patient location during evaluation: PACU Anesthesia Type: General Level of consciousness: awake and alert Pain management: pain level controlled Vital Signs Assessment: post-procedure vital signs reviewed and stable Respiratory status: spontaneous breathing, nonlabored ventilation, respiratory function stable and patient connected to nasal cannula oxygen Cardiovascular status: blood pressure returned to baseline and stable Postop Assessment: no apparent nausea or vomiting Anesthetic complications: no  No notable events documented.  Last Vitals:  Vitals:   09/19/23 1200 09/19/23 1215  BP: 104/85 137/67  Pulse: 63 64  Resp: 11 16  Temp:    SpO2: 93% 96%    Last Pain:  Vitals:   09/19/23 1221  TempSrc:   PainSc: 5                  Guiseppe Flanagan L Anthonia Monger

## 2023-09-19 NOTE — Anesthesia Procedure Notes (Signed)
 Procedure Name: LMA Insertion Date/Time: 09/19/2023 10:58 AM  Performed by: Jenean Escandon C, CRNAPre-anesthesia Checklist: Patient identified, Emergency Drugs available, Suction available and Patient being monitored Patient Re-evaluated:Patient Re-evaluated prior to induction Oxygen Delivery Method: Circle System Utilized Preoxygenation: Pre-oxygenation with 100% oxygen Induction Type: IV induction Ventilation: Mask ventilation without difficulty LMA: LMA inserted LMA Size: 4.0 Number of attempts: 1 Airway Equipment and Method: Bite block Placement Confirmation: positive ETCO2 Tube secured with: Tape Dental Injury: Teeth and Oropharynx as per pre-operative assessment

## 2023-09-20 ENCOUNTER — Encounter (HOSPITAL_COMMUNITY): Payer: Self-pay | Admitting: Obstetrics and Gynecology

## 2023-09-20 LAB — SURGICAL PATHOLOGY

## 2023-09-21 ENCOUNTER — Other Ambulatory Visit: Payer: Self-pay | Admitting: Family Medicine

## 2023-09-21 DIAGNOSIS — R161 Splenomegaly, not elsewhere classified: Secondary | ICD-10-CM

## 2023-09-21 DIAGNOSIS — R591 Generalized enlarged lymph nodes: Secondary | ICD-10-CM

## 2023-10-03 DIAGNOSIS — Z4889 Encounter for other specified surgical aftercare: Secondary | ICD-10-CM | POA: Diagnosis not present

## 2023-10-03 DIAGNOSIS — N840 Polyp of corpus uteri: Secondary | ICD-10-CM | POA: Diagnosis not present

## 2023-10-10 ENCOUNTER — Ambulatory Visit
Admission: RE | Admit: 2023-10-10 | Discharge: 2023-10-10 | Discharge status: Home / Self Care | Source: Ambulatory Visit | Attending: Family Medicine | Admitting: Family Medicine

## 2023-10-10 DIAGNOSIS — R590 Localized enlarged lymph nodes: Secondary | ICD-10-CM | POA: Diagnosis not present

## 2023-10-10 DIAGNOSIS — D1803 Hemangioma of intra-abdominal structures: Secondary | ICD-10-CM | POA: Diagnosis not present

## 2023-10-10 DIAGNOSIS — K449 Diaphragmatic hernia without obstruction or gangrene: Secondary | ICD-10-CM | POA: Diagnosis not present

## 2023-10-10 DIAGNOSIS — R591 Generalized enlarged lymph nodes: Secondary | ICD-10-CM

## 2023-10-10 DIAGNOSIS — R161 Splenomegaly, not elsewhere classified: Secondary | ICD-10-CM | POA: Diagnosis not present

## 2023-10-10 DIAGNOSIS — K7460 Unspecified cirrhosis of liver: Secondary | ICD-10-CM | POA: Diagnosis not present

## 2023-10-10 MED ORDER — IOPAMIDOL (ISOVUE-300) INJECTION 61%
100.0000 mL | Freq: Once | INTRAVENOUS | Status: AC | PRN
  Administered 2023-10-10: 100 mL via INTRAVENOUS

## 2023-11-05 DIAGNOSIS — K7460 Unspecified cirrhosis of liver: Secondary | ICD-10-CM | POA: Diagnosis not present

## 2023-11-09 DIAGNOSIS — Z1231 Encounter for screening mammogram for malignant neoplasm of breast: Secondary | ICD-10-CM | POA: Diagnosis not present

## 2023-12-03 DIAGNOSIS — F330 Major depressive disorder, recurrent, mild: Secondary | ICD-10-CM | POA: Diagnosis not present

## 2023-12-03 DIAGNOSIS — E785 Hyperlipidemia, unspecified: Secondary | ICD-10-CM | POA: Diagnosis not present

## 2023-12-03 DIAGNOSIS — E669 Obesity, unspecified: Secondary | ICD-10-CM | POA: Diagnosis not present

## 2023-12-03 DIAGNOSIS — I10 Essential (primary) hypertension: Secondary | ICD-10-CM | POA: Diagnosis not present

## 2023-12-03 DIAGNOSIS — R252 Cramp and spasm: Secondary | ICD-10-CM | POA: Diagnosis not present

## 2023-12-03 DIAGNOSIS — E1169 Type 2 diabetes mellitus with other specified complication: Secondary | ICD-10-CM | POA: Diagnosis not present

## 2023-12-03 DIAGNOSIS — F419 Anxiety disorder, unspecified: Secondary | ICD-10-CM | POA: Diagnosis not present

## 2024-02-18 DIAGNOSIS — R197 Diarrhea, unspecified: Secondary | ICD-10-CM | POA: Diagnosis not present

## 2024-02-18 DIAGNOSIS — K7460 Unspecified cirrhosis of liver: Secondary | ICD-10-CM | POA: Diagnosis not present

## 2024-02-26 DIAGNOSIS — E119 Type 2 diabetes mellitus without complications: Secondary | ICD-10-CM | POA: Diagnosis not present

## 2024-02-26 DIAGNOSIS — H524 Presbyopia: Secondary | ICD-10-CM | POA: Diagnosis not present

## 2024-02-26 DIAGNOSIS — H353131 Nonexudative age-related macular degeneration, bilateral, early dry stage: Secondary | ICD-10-CM | POA: Diagnosis not present

## 2024-02-26 DIAGNOSIS — H52203 Unspecified astigmatism, bilateral: Secondary | ICD-10-CM | POA: Diagnosis not present

## 2024-02-28 DIAGNOSIS — Z1151 Encounter for screening for human papillomavirus (HPV): Secondary | ICD-10-CM | POA: Diagnosis not present

## 2024-02-28 DIAGNOSIS — Z01419 Encounter for gynecological examination (general) (routine) without abnormal findings: Secondary | ICD-10-CM | POA: Diagnosis not present

## 2024-02-28 DIAGNOSIS — R87610 Atypical squamous cells of undetermined significance on cytologic smear of cervix (ASC-US): Secondary | ICD-10-CM | POA: Diagnosis not present

## 2024-02-28 DIAGNOSIS — Z124 Encounter for screening for malignant neoplasm of cervix: Secondary | ICD-10-CM | POA: Diagnosis not present

## 2024-02-28 DIAGNOSIS — Z113 Encounter for screening for infections with a predominantly sexual mode of transmission: Secondary | ICD-10-CM | POA: Diagnosis not present

## 2024-03-14 DIAGNOSIS — F330 Major depressive disorder, recurrent, mild: Secondary | ICD-10-CM | POA: Diagnosis not present

## 2024-03-14 DIAGNOSIS — I10 Essential (primary) hypertension: Secondary | ICD-10-CM | POA: Diagnosis not present

## 2024-03-14 DIAGNOSIS — K7460 Unspecified cirrhosis of liver: Secondary | ICD-10-CM | POA: Diagnosis not present

## 2024-03-14 DIAGNOSIS — M8588 Other specified disorders of bone density and structure, other site: Secondary | ICD-10-CM | POA: Diagnosis not present

## 2024-03-14 DIAGNOSIS — F419 Anxiety disorder, unspecified: Secondary | ICD-10-CM | POA: Diagnosis not present

## 2024-03-14 DIAGNOSIS — E1169 Type 2 diabetes mellitus with other specified complication: Secondary | ICD-10-CM | POA: Diagnosis not present

## 2024-03-14 DIAGNOSIS — Z0000 Encounter for general adult medical examination without abnormal findings: Secondary | ICD-10-CM | POA: Diagnosis not present

## 2024-03-14 DIAGNOSIS — Z23 Encounter for immunization: Secondary | ICD-10-CM | POA: Diagnosis not present

## 2024-03-14 DIAGNOSIS — Z79899 Other long term (current) drug therapy: Secondary | ICD-10-CM | POA: Diagnosis not present

## 2024-03-14 DIAGNOSIS — R300 Dysuria: Secondary | ICD-10-CM | POA: Diagnosis not present

## 2024-03-14 DIAGNOSIS — E785 Hyperlipidemia, unspecified: Secondary | ICD-10-CM | POA: Diagnosis not present

## 2024-03-14 DIAGNOSIS — E559 Vitamin D deficiency, unspecified: Secondary | ICD-10-CM | POA: Diagnosis not present

## 2024-04-03 DIAGNOSIS — M79672 Pain in left foot: Secondary | ICD-10-CM | POA: Diagnosis not present

## 2024-04-03 DIAGNOSIS — R300 Dysuria: Secondary | ICD-10-CM | POA: Diagnosis not present

## 2024-04-07 DIAGNOSIS — M722 Plantar fascial fibromatosis: Secondary | ICD-10-CM | POA: Diagnosis not present

## 2024-04-08 DIAGNOSIS — M25511 Pain in right shoulder: Secondary | ICD-10-CM | POA: Diagnosis not present
